# Patient Record
Sex: Female | Born: 1940 | Race: White | Hispanic: No | Smoking: Former smoker
Health system: Southern US, Community
[De-identification: ages and names within clinical notes are randomized; demographics above are authoritative.]

## PROBLEM LIST (undated history)

## (undated) DIAGNOSIS — Z8719 Personal history of other diseases of the digestive system: Secondary | ICD-10-CM

## (undated) DIAGNOSIS — I82409 Acute embolism and thrombosis of unspecified deep veins of unspecified lower extremity: Secondary | ICD-10-CM

## (undated) DIAGNOSIS — K219 Gastro-esophageal reflux disease without esophagitis: Secondary | ICD-10-CM

## (undated) DIAGNOSIS — J45909 Unspecified asthma, uncomplicated: Secondary | ICD-10-CM

## (undated) DIAGNOSIS — I4891 Unspecified atrial fibrillation: Secondary | ICD-10-CM

## (undated) DIAGNOSIS — M1990 Unspecified osteoarthritis, unspecified site: Secondary | ICD-10-CM

## (undated) DIAGNOSIS — D75839 Thrombocytosis, unspecified: Secondary | ICD-10-CM

## (undated) DIAGNOSIS — K5792 Diverticulitis of intestine, part unspecified, without perforation or abscess without bleeding: Secondary | ICD-10-CM

## (undated) DIAGNOSIS — Z87442 Personal history of urinary calculi: Secondary | ICD-10-CM

## (undated) DIAGNOSIS — K449 Diaphragmatic hernia without obstruction or gangrene: Secondary | ICD-10-CM

## (undated) DIAGNOSIS — N1832 Chronic kidney disease, stage 3b: Secondary | ICD-10-CM

## (undated) DIAGNOSIS — Z9889 Other specified postprocedural states: Secondary | ICD-10-CM

## (undated) DIAGNOSIS — D649 Anemia, unspecified: Secondary | ICD-10-CM

## (undated) DIAGNOSIS — H269 Unspecified cataract: Secondary | ICD-10-CM

## (undated) DIAGNOSIS — I480 Paroxysmal atrial fibrillation: Secondary | ICD-10-CM

## (undated) DIAGNOSIS — Z148 Genetic carrier of other disease: Secondary | ICD-10-CM

## (undated) DIAGNOSIS — E119 Type 2 diabetes mellitus without complications: Secondary | ICD-10-CM

## (undated) DIAGNOSIS — I499 Cardiac arrhythmia, unspecified: Secondary | ICD-10-CM

## (undated) DIAGNOSIS — J189 Pneumonia, unspecified organism: Secondary | ICD-10-CM

## (undated) DIAGNOSIS — C801 Malignant (primary) neoplasm, unspecified: Secondary | ICD-10-CM

## (undated) HISTORY — DX: Unspecified osteoarthritis, unspecified site: M19.90

## (undated) HISTORY — DX: Unspecified atrial fibrillation: I48.91

## (undated) HISTORY — DX: Type 2 diabetes mellitus without complications: E11.9

## (undated) HISTORY — DX: Acute embolism and thrombosis of unspecified deep veins of unspecified lower extremity: I82.409

## (undated) HISTORY — PX: BREAST SURGERY: SHX581

## (undated) HISTORY — DX: Paroxysmal atrial fibrillation: I48.0

---
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
## 1970-10-08 HISTORY — PX: APPENDECTOMY: SHX54

## 1970-10-08 HISTORY — PX: CHOLECYSTECTOMY: SHX55

## 2007-10-09 HISTORY — PX: OTHER SURGICAL HISTORY: SHX169

## 2007-12-07 DIAGNOSIS — I219 Acute myocardial infarction, unspecified: Secondary | ICD-10-CM

## 2007-12-07 HISTORY — DX: Acute myocardial infarction, unspecified: I21.9

## 2013-10-08 DIAGNOSIS — C50919 Malignant neoplasm of unspecified site of unspecified female breast: Secondary | ICD-10-CM

## 2013-10-08 DIAGNOSIS — C50911 Malignant neoplasm of unspecified site of right female breast: Secondary | ICD-10-CM

## 2013-10-08 DIAGNOSIS — C801 Malignant (primary) neoplasm, unspecified: Secondary | ICD-10-CM

## 2013-10-08 DIAGNOSIS — Z923 Personal history of irradiation: Secondary | ICD-10-CM

## 2013-10-08 HISTORY — DX: Malignant neoplasm of unspecified site of right female breast: C50.911

## 2013-10-08 HISTORY — DX: Malignant (primary) neoplasm, unspecified: C80.1

## 2013-10-08 HISTORY — PX: BREAST SURGERY: SHX581

## 2013-10-08 HISTORY — PX: TUMOR REMOVAL: SHX12

## 2013-10-08 HISTORY — DX: Malignant neoplasm of unspecified site of unspecified female breast: C50.919

## 2013-10-08 HISTORY — PX: BREAST LUMPECTOMY: SHX2

## 2013-10-08 HISTORY — DX: Personal history of irradiation: Z92.3

## 2014-10-08 DIAGNOSIS — A0472 Enterocolitis due to Clostridium difficile, not specified as recurrent: Secondary | ICD-10-CM

## 2014-10-08 HISTORY — DX: Enterocolitis due to Clostridium difficile, not specified as recurrent: A04.72

## 2017-10-08 DIAGNOSIS — A419 Sepsis, unspecified organism: Secondary | ICD-10-CM

## 2017-10-08 HISTORY — DX: Sepsis, unspecified organism: A41.9

## 2018-05-08 HISTORY — PX: COLECTOMY: SHX59

## 2019-08-05 ENCOUNTER — Ambulatory Visit: Payer: Medicare Other | Admitting: Gastroenterology

## 2019-08-12 ENCOUNTER — Telehealth: Payer: Self-pay

## 2019-08-12 NOTE — Telephone Encounter (Signed)
Called patient to move patient from [place] to [place] and move her time on 09/17/2019. Patient wants to come at 11:00am on 09/17/19. Patient would like [place] to mail her the address and a reminder of the appointment.

## 2019-09-17 ENCOUNTER — Encounter: Payer: Self-pay | Admitting: Gastroenterology

## 2019-09-17 ENCOUNTER — Encounter (INDEPENDENT_AMBULATORY_CARE_PROVIDER_SITE_OTHER): Payer: Self-pay

## 2019-09-17 ENCOUNTER — Other Ambulatory Visit: Payer: Self-pay

## 2019-09-17 ENCOUNTER — Ambulatory Visit: Payer: Medicare Other | Admitting: Gastroenterology

## 2019-09-17 VITALS — BP 125/85 | HR 81 | Temp 98.4°F | Ht <= 58 in | Wt 188.6 lb

## 2019-09-17 DIAGNOSIS — K5732 Diverticulitis of large intestine without perforation or abscess without bleeding: Secondary | ICD-10-CM
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
## 2019-09-18 ENCOUNTER — Encounter: Payer: Self-pay | Admitting: Gastroenterology

## 2019-09-24 ENCOUNTER — Telehealth: Payer: Self-pay | Admitting: Gastroenterology

## 2019-09-24 NOTE — Telephone Encounter (Signed)
Pt left vm she states she was supposed to call you back with a a day for her colonoscopy she states Friday after the new years be best

## 2019-09-25 ENCOUNTER — Other Ambulatory Visit: Payer: Self-pay

## 2019-09-25 ENCOUNTER — Telehealth: Payer: Self-pay

## 2019-09-25 DIAGNOSIS — K219 Gastro-esophageal reflux disease without esophagitis: Secondary | ICD-10-CM

## 2019-09-25 DIAGNOSIS — K449 Diaphragmatic hernia without obstruction or gangrene: Secondary | ICD-10-CM

## 2019-09-25 NOTE — Telephone Encounter (Signed)
Returned call to schedule patients colonoscopy.  She states that Dr. Allen Norris wanted her to have an upper and lower.  Reviewed chart note with Ginger. Chart note mentioned colonoscopy.  Discussed with Ginger and added an upper due to GERD, and hiatal hernia.  Scheduled for January 8th 2021.  Thanks [employer]

## 2019-10-14 ENCOUNTER — Other Ambulatory Visit: Payer: Self-pay

## 2019-10-14 ENCOUNTER — Other Ambulatory Visit
Admission: RE | Admit: 2019-10-14 | Discharge: 2019-10-14 | Disposition: A | Discharge status: Home / Self Care | Payer: Medicare Other | Source: Ambulatory Visit | Attending: Gastroenterology | Admitting: Gastroenterology

## 2019-10-14 DIAGNOSIS — Z20822 Contact with and (suspected) exposure to covid-19: Secondary | ICD-10-CM | POA: Diagnosis not present

## 2019-10-14 DIAGNOSIS — Z01812 Encounter for preprocedural laboratory examination: Secondary | ICD-10-CM | POA: Diagnosis present

## 2019-10-14 LAB — SARS CORONAVIRUS 2 (TAT 6-24 HRS): SARS Coronavirus 2: NEGATIVE

## 2019-10-15 ENCOUNTER — Encounter: Payer: Self-pay | Admitting: Gastroenterology

## 2019-10-16 ENCOUNTER — Encounter: Payer: Self-pay | Admitting: Gastroenterology

## 2019-10-16 ENCOUNTER — Ambulatory Visit: Payer: Medicare Other | Admitting: Registered Nurse

## 2019-10-16 ENCOUNTER — Encounter
Admission: RE | Disposition: A | Discharge status: Home / Self Care | Payer: Self-pay | Source: Home / Self Care | Attending: Gastroenterology

## 2019-10-16 ENCOUNTER — Ambulatory Visit
Admission: RE | Admit: 2019-10-16 | Discharge: 2019-10-16 | Disposition: A | Discharge status: Home / Self Care | Payer: Medicare Other | Attending: Gastroenterology | Admitting: Gastroenterology

## 2019-10-16 ENCOUNTER — Other Ambulatory Visit: Payer: Self-pay

## 2019-10-16 DIAGNOSIS — K635 Polyp of colon: Secondary | ICD-10-CM | POA: Diagnosis not present

## 2019-10-16 DIAGNOSIS — R12 Heartburn: Secondary | ICD-10-CM | POA: Insufficient documentation

## 2019-10-16 DIAGNOSIS — Z79899 Other long term (current) drug therapy: Secondary | ICD-10-CM | POA: Insufficient documentation

## 2019-10-16 DIAGNOSIS — Z885 Allergy status to narcotic agent status: Secondary | ICD-10-CM | POA: Insufficient documentation

## 2019-10-16 DIAGNOSIS — K449 Diaphragmatic hernia without obstruction or gangrene: Secondary | ICD-10-CM | POA: Diagnosis not present

## 2019-10-16 DIAGNOSIS — Z881 Allergy status to other antibiotic agents status: Secondary | ICD-10-CM | POA: Diagnosis not present

## 2019-10-16 DIAGNOSIS — Z1211 Encounter for screening for malignant neoplasm of colon: Secondary | ICD-10-CM | POA: Diagnosis not present

## 2019-10-16 DIAGNOSIS — K219 Gastro-esophageal reflux disease without esophagitis: Secondary | ICD-10-CM

## 2019-10-16 DIAGNOSIS — Z888 Allergy status to other drugs, medicaments and biological substances status: Secondary | ICD-10-CM | POA: Insufficient documentation

## 2019-10-16 DIAGNOSIS — K5289 Other specified noninfective gastroenteritis and colitis: Secondary | ICD-10-CM | POA: Diagnosis not present

## 2019-10-16 DIAGNOSIS — K222 Esophageal obstruction: Secondary | ICD-10-CM

## 2019-10-16 DIAGNOSIS — Z933 Colostomy status: Secondary | ICD-10-CM | POA: Insufficient documentation

## 2019-10-16 DIAGNOSIS — Z880 Allergy status to penicillin: Secondary | ICD-10-CM | POA: Insufficient documentation

## 2019-10-16 DIAGNOSIS — D120 Benign neoplasm of cecum: Secondary | ICD-10-CM

## 2019-10-16 HISTORY — PX: COLONOSCOPY WITH PROPOFOL: SHX5780

## 2019-10-16 HISTORY — PX: ESOPHAGOGASTRODUODENOSCOPY (EGD) WITH PROPOFOL: SHX5813

## 2019-10-16 HISTORY — DX: Cardiac arrhythmia, unspecified: I49.9

## 2019-10-16 LAB — GLUCOSE, CAPILLARY: Glucose-Capillary: 120 mg/dL — ABNORMAL HIGH (ref 70–99)

## 2019-10-16 SURGERY — COLONOSCOPY WITH PROPOFOL
Anesthesia: General

## 2019-10-16 MED ORDER — PROPOFOL 10 MG/ML IV BOLUS
INTRAVENOUS | Status: DC | PRN
  Administered 2019-10-16: 80 mg via INTRAVENOUS

## 2019-10-16 MED ORDER — SODIUM CHLORIDE 0.9 % IV SOLN
INTRAVENOUS | Status: DC
  Administered 2019-10-16: 1000 mL via INTRAVENOUS

## 2019-10-16 MED ORDER — PROPOFOL 500 MG/50ML IV EMUL
INTRAVENOUS | Status: DC | PRN
  Administered 2019-10-16: 140 ug/kg/min via INTRAVENOUS

## 2019-10-16 NOTE — Anesthesia Preprocedure Evaluation (Signed)
Anesthesia Evaluation  Patient identified by MRN, date of birth, ID band Patient awake    Reviewed: Allergy & Precautions, NPO status , Patient's Chart, lab work & pertinent test results  History of Anesthesia Complications Negative for: history of anesthetic complications  Airway Mallampati: II  TM Distance: >3 FB Neck ROM: Full    Dental  (+) Edentulous Upper, Edentulous Lower   Pulmonary neg pulmonary ROS, neg sleep apnea, neg COPD,    breath sounds clear to auscultation- rhonchi (-) wheezing      Cardiovascular (-) hypertension+ Past MI  (-) CAD, (-) Cardiac Stents and (-) CABG + dysrhythmias (s/p ablation) Atrial Fibrillation  Rhythm:Regular Rate:Normal - Systolic murmurs and - Diastolic murmurs    Neuro/Psych neg Seizures negative neurological ROS  negative psych ROS   GI/Hepatic negative GI ROS, Neg liver ROS,   Endo/Other  negative endocrine ROSneg diabetes  Renal/GU negative Renal ROS     Musculoskeletal negative musculoskeletal ROS (+)   Abdominal (+) - obese,   Peds  Hematology negative hematology ROS (+)   Anesthesia Other Findings Past Medical History: No date: Cancer ([place])     Comment:  breast No date: Dysrhythmia   Reproductive/Obstetrics                             Anesthesia Physical Anesthesia Plan  ASA: II  Anesthesia Plan: General   Post-op Pain Management:    Induction: Intravenous  PONV Risk Score and Plan: 2 and Propofol infusion  Airway Management Planned: Natural Airway  Additional Equipment:   Intra-op Plan:   Post-operative Plan:   Informed Consent: I have reviewed the patients History and Physical, chart, labs and discussed the procedure including the risks, benefits and alternatives for the proposed anesthesia with the patient or authorized representative who has indicated his/her understanding and acceptance.     Dental advisory  given  Plan Discussed with: CRNA and Anesthesiologist  Anesthesia Plan Comments:         Anesthesia Quick Evaluation

## 2019-10-16 NOTE — Brief Op Note (Signed)
Colonoscopy performed through colonic stoma, then rectum.

## 2019-10-16 NOTE — Interval H&P Note (Signed)
History and Physical Interval Note:  10/16/2019 11:27 AM  Nancy Hickman  has presented today for surgery, with the diagnosis of Hiatal hernia, GERD, Colostomy, Diverticulitis.  The various methods of treatment have been discussed with the patient and family. After consideration of risks, benefits and other options for treatment, the patient has consented to  Procedure(s): COLONOSCOPY WITH PROPOFOL (N/A) ESOPHAGOGASTRODUODENOSCOPY (EGD) WITH PROPOFOL (N/A) as a surgical intervention.  The patient's history has been reviewed, patient examined, no change in status, stable for surgery.  I have reviewed the patient's chart and labs.  Questions were answered to the patient's satisfaction.     [name] [employer]

## 2019-10-16 NOTE — Transfer of Care (Signed)
Immediate Anesthesia Transfer of Care Note  Patient: Nancy Hickman  Procedure(s) Performed: COLONOSCOPY WITH PROPOFOL (N/A ) ESOPHAGOGASTRODUODENOSCOPY (EGD) WITH PROPOFOL (N/A )  Patient Location: PACU  Anesthesia Type:General  Level of Consciousness: sedated  Airway & Oxygen Therapy: Patient Spontanous Breathing and Patient connected to nasal cannula oxygen  Post-op Assessment: Report given to RN and Post -op Vital signs reviewed and stable  Post vital signs: Reviewed and stable  Last Vitals:  Vitals Value Taken Time  BP 103/61 10/16/19 1203  Temp    Pulse 95 10/16/19 1203  Resp 18 10/16/19 1203  SpO2 90 % 10/16/19 1203  Vitals shown include unvalidated device data.  Last Pain:  Vitals:   10/16/19 1201  TempSrc: (P) Temporal  PainSc:          Complications: No apparent anesthesia complications

## 2019-10-16 NOTE — Op Note (Signed)
[facility] Gastroenterology Patient Name: Nancy Hickman Procedure Date: 10/16/2019 11:26 AM MRN: [id number] Account #: [id number] Date of Birth: 1941-09-28 Admit Type: Outpatient Age: 79 Room: [facility] ENDO ROOM 4 Gender: Female Note Status: Finalized Procedure:             Upper GI endoscopy Indications:           Heartburn Providers:             Lucilla Lame MD, MD Medicines:             Propofol per Anesthesia Complications:         No immediate complications. Procedure:             Pre-Anesthesia Assessment:                        - Prior to the procedure, a History and Physical was                         performed, and patient medications and allergies were                         reviewed. The patient's tolerance of previous                         anesthesia was also reviewed. The risks and benefits                         of the procedure and the sedation options and risks                         were discussed with the patient. All questions were                         answered, and informed consent was obtained. Prior                         Anticoagulants: The patient has taken no previous                         anticoagulant or antiplatelet agents. ASA Grade                         Assessment: II - A patient with mild systemic disease.                         After reviewing the risks and benefits, the patient                         was deemed in satisfactory condition to undergo the                         procedure.                        After obtaining informed consent, the endoscope was                         passed under direct vision. Throughout the procedure,  the patient's blood pressure, pulse, and oxygen                         saturations were monitored continuously. The Endoscope                         was introduced through the mouth, and advanced to the                         second part of duodenum. The upper  GI endoscopy was                         accomplished without difficulty. The patient tolerated                         the procedure well. Findings:      A large hiatal hernia was present.      One benign-appearing, intrinsic moderate stenosis was found at the       gastroesophageal junction. The stenosis was traversed. A TTS dilator was       passed through the scope. Dilation with a 15-16.5-18 mm balloon dilator       was performed to 18 mm. The dilation site was examined following       endoscope reinsertion and showed complete resolution of luminal       narrowing.      The stomach was normal.      The examined duodenum was normal. Impression:            - Large hiatal hernia.                        - Benign-appearing esophageal stenosis. Dilated.                        - Normal stomach.                        - Normal examined duodenum.                        - No specimens collected. Recommendation:        - Discharge patient to home.                        - Resume previous diet.                        - Continue present medications.                        - Perform a colonoscopy today. Procedure Code(s):     --- Professional ---                        [phone], Esophagogastroduodenoscopy, flexible,                         transoral; with transendoscopic balloon dilation of                         esophagus (less than 30 mm diameter) CPT copyright 2019 American Medical Association. All rights reserved.  The codes documented in this report are preliminary and upon coder review may  be revised to meet current compliance requirements. Lucilla Lame MD, MD 10/16/2019 11:44:19 AM This report has been signed electronically. Number of Addenda: 0 Note Initiated On: 10/16/2019 11:26 AM Estimated Blood Loss:  Estimated blood loss: none.      [facility]

## 2019-10-16 NOTE — Anesthesia Postprocedure Evaluation (Signed)
Anesthesia Post Note  Patient: Nancy Hickman  Procedure(s) Performed: COLONOSCOPY WITH PROPOFOL (N/A ) ESOPHAGOGASTRODUODENOSCOPY (EGD) WITH PROPOFOL (N/A )  Patient location during evaluation: Endoscopy Anesthesia Type: General Level of consciousness: awake and alert and oriented Pain management: pain level controlled Vital Signs Assessment: post-procedure vital signs reviewed and stable Respiratory status: spontaneous breathing, nonlabored ventilation and respiratory function stable Cardiovascular status: blood pressure returned to baseline and stable Postop Assessment: no signs of nausea or vomiting Anesthetic complications: no     Last Vitals:  Vitals:   10/16/19 1221 10/16/19 1230  BP: 120/81 (!) 124/97  Pulse: 91 92  Resp: 16 20  Temp:    SpO2: 100% 100%    Last Pain:  Vitals:   10/16/19 1230  TempSrc:   PainSc: 0-No pain                 Nicle Connole

## 2019-10-16 NOTE — Op Note (Signed)
[facility] Gastroenterology Patient Name: Nancy Hickman Procedure Date: 10/16/2019 11:25 AM MRN: [id number] Account #: [id number] Date of Birth: 02-09-1941 Admit Type: Outpatient Age: 79 Room: [facility] ENDO ROOM 4 Gender: Female Note Status: Finalized Procedure:             Colonoscopy Indications:           Screening for colorectal malignant neoplasm Providers:             Lucilla Lame MD, MD Medicines:             Propofol per Anesthesia Complications:         No immediate complications. Procedure:             Pre-Anesthesia Assessment:                        - Prior to the procedure, a History and Physical was                         performed, and patient medications and allergies were                         reviewed. The patient's tolerance of previous                         anesthesia was also reviewed. The risks and benefits                         of the procedure and the sedation options and risks                         were discussed with the patient. All questions were                         answered, and informed consent was obtained. Prior                         Anticoagulants: The patient has taken no previous                         anticoagulant or antiplatelet agents. ASA Grade                         Assessment: II - A patient with mild systemic disease.                         After reviewing the risks and benefits, the patient                         was deemed in satisfactory condition to undergo the                         procedure.                        After obtaining informed consent, the colonoscope was                         passed under direct vision. Throughout the procedure,  the patient's blood pressure, pulse, and oxygen                         saturations were monitored continuously. The                         Colonoscope was introduced through the descending                         colostomy and  advanced to the the cecum, identified by                         appendiceal orifice and ileocecal valve. The                         colonoscopy was performed without difficulty. The                         patient tolerated the procedure well. The quality of                         the bowel preparation was excellent. The Colonoscope                         was introduced through the anus and advanced to the                         the sigmoid colon to examine an anastomosis. This was                         the intended extent. Findings:      The perianal and digital rectal examinations were normal.      A 3 mm polyp was found in the cecum. The polyp was sessile. The polyp       was removed with a cold biopsy forceps. Resection and retrieval were       complete.      A few small-mouthed diverticula were found in the descending colon.      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 Impression:            - One 3 mm polyp in the cecum, removed with a cold                         biopsy forceps. Resected and retrieved.                        - Diverticulosis in the descending colon.                        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 Recommendation:        -  Discharge patient to home.                        - Resume previous diet.                        - Continue present medications.                        - Await pathology results. Procedure Code(s):     --- Professional ---                        [id number], 32, Colonoscopy through stoma; with biopsy,                         single or multiple Diagnosis Code(s):     --- Professional ---                        Z12.11, Encounter for screening for malignant neoplasm                         of colon                         K63.5, Polyp of colon CPT copyright 2019 American Medical Association. All rights reserved. The codes documented in this report are preliminary and upon coder review may  be revised to meet current compliance requirements. Lucilla Lame MD, MD 10/16/2019 12:00:48 PM This report has been signed electronically. Number of Addenda: 0 Note Initiated On: 10/16/2019 11:25 AM Scope Withdrawal Time: 0 hours 6 minutes 52 seconds  Estimated Blood Loss:  Estimated blood loss: none.      [facility]

## 2019-10-19 LAB — SURGICAL PATHOLOGY

## 2019-10-20 ENCOUNTER — Encounter: Payer: Self-pay | Admitting: Gastroenterology

## 2020-02-18 ENCOUNTER — Other Ambulatory Visit: Payer: Self-pay

## 2020-02-18 ENCOUNTER — Emergency Department
Admission: EM | Admit: 2020-02-18 | Discharge: 2020-02-18 | Disposition: A | Discharge status: Home / Self Care | Payer: Medicare Other | Attending: Emergency Medicine | Admitting: Emergency Medicine

## 2020-02-18 ENCOUNTER — Emergency Department: Payer: Medicare Other

## 2020-02-18 DIAGNOSIS — R1032 Left lower quadrant pain: Secondary | ICD-10-CM | POA: Diagnosis present

## 2020-02-18 DIAGNOSIS — Z79899 Other long term (current) drug therapy: Secondary | ICD-10-CM | POA: Diagnosis not present

## 2020-02-18 DIAGNOSIS — N200 Calculus of kidney: Secondary | ICD-10-CM | POA: Insufficient documentation

## 2020-02-18 LAB — COMPREHENSIVE METABOLIC PANEL
ALT: 23 U/L (ref 0–44)
AST: 26 U/L (ref 15–41)
Albumin: 3.7 g/dL (ref 3.5–5.0)
Alkaline Phosphatase: 112 U/L (ref 38–126)
Anion gap: 8 (ref 5–15)
BUN: 13 mg/dL (ref 8–23)
CO2: 23 mmol/L (ref 22–32)
Calcium: 9.1 mg/dL (ref 8.9–10.3)
Chloride: 107 mmol/L (ref 98–111)
Creatinine, Ser: 1.09 mg/dL — ABNORMAL HIGH (ref 0.44–1.00)
GFR calc Af Amer: 56 mL/min — ABNORMAL LOW (ref >60)
GFR calc non Af Amer: 49 mL/min — ABNORMAL LOW (ref >60)
Glucose, Bld: 163 mg/dL — ABNORMAL HIGH (ref 70–99)
Potassium: 5.4 mmol/L — ABNORMAL HIGH (ref 3.5–5.1)
Sodium: 138 mmol/L (ref 135–145)
Total Bilirubin: 0.5 mg/dL (ref 0.3–1.2)
Total Protein: 7.8 g/dL (ref 6.5–8.1)

## 2020-02-18 LAB — CBC
HCT: 36.4 % (ref 36.0–46.0)
Hemoglobin: 11.7 g/dL — ABNORMAL LOW (ref 12.0–15.0)
MCH: 28.4 pg (ref 26.0–34.0)
MCHC: 32.1 g/dL (ref 30.0–36.0)
MCV: 88.3 fL (ref 80.0–100.0)
Platelets: 443 10*3/uL — ABNORMAL HIGH (ref 150–400)
RBC: 4.12 MIL/uL (ref 3.87–5.11)
RDW: 16.4 % — ABNORMAL HIGH (ref 11.5–15.5)
WBC: 9.4 10*3/uL (ref 4.0–10.5)
nRBC: 0 % (ref 0.0–0.2)

## 2020-02-18 LAB — URINALYSIS, COMPLETE (UACMP) WITH MICROSCOPIC
Bilirubin Urine: NEGATIVE
Glucose, UA: NEGATIVE mg/dL
Ketones, ur: NEGATIVE mg/dL
Leukocytes,Ua: NEGATIVE
Nitrite: NEGATIVE
Protein, ur: NEGATIVE mg/dL
RBC / HPF: >50 RBC/hpf — ABNORMAL HIGH (ref 0–5)
Specific Gravity, Urine: 1.023 (ref 1.005–1.030)
pH: 5 (ref 5.0–8.0)

## 2020-02-18 LAB — LIPASE, BLOOD: Lipase: 32 U/L (ref 11–51)

## 2020-02-18 MED ORDER — TRAMADOL HCL 50 MG PO TABS: 50.0000 mg | ORAL_TABLET | Freq: Four times a day (QID) | ORAL | 0 refills | Status: DC | PRN

## 2020-02-18 MED ORDER — SODIUM CHLORIDE 0.9 % IV BOLUS
500.0000 mL | Freq: Once | INTRAVENOUS | Status: AC
  Administered 2020-02-18: 500 mL via INTRAVENOUS

## 2020-02-18 MED ORDER — FENTANYL CITRATE (PF) 100 MCG/2ML IJ SOLN
50.0000 ug | Freq: Once | INTRAMUSCULAR | Status: AC
  Administered 2020-02-18: 50 ug via INTRAVENOUS
  Filled 2020-02-18: qty 2

## 2020-02-18 MED ORDER — ONDANSETRON HCL 4 MG/2ML IJ SOLN
4.0000 mg | Freq: Once | INTRAMUSCULAR | Status: AC
  Administered 2020-02-18: 4 mg via INTRAVENOUS
  Filled 2020-02-18: qty 2

## 2020-02-18 MED ORDER — ONDANSETRON HCL 4 MG PO TABS: 4.0000 mg | ORAL_TABLET | Freq: Three times a day (TID) | ORAL | 0 refills | Status: DC | PRN

## 2020-02-18 NOTE — ED Notes (Signed)
Pt reports LLQ abdominal pain that started last week and went away. Pt states pain returned yesterday and has consistently worsened. Pt has colostomy in place since 2019. States no trouble with site. Reports diarrhea yesterday but believes it is due to food she ate. Pt states she is nauseated and is dry heaving.

## 2020-02-18 NOTE — ED Triage Notes (Signed)
Pt here with abd pain that started last week but went away. Pt believes that it may be a kidney stone. Pt in NAD.

## 2020-02-18 NOTE — Discharge Instructions (Addendum)
As we discussed please talk to your primary care physician about obtaining a dedicated MRI of your kidneys. Please seek medical attention for any high fevers, chest pain, shortness of breath, change in behavior, persistent vomiting, bloody stool or any other new or concerning symptoms.

## 2020-02-18 NOTE — ED Notes (Signed)
Awaiting pt to arrive to room from CT

## 2020-02-18 NOTE — ED Provider Notes (Signed)
[facility] Emergency Department Provider Note   ____________________________________________   I have reviewed the triage vital signs and the nursing notes.   HISTORY  Chief Complaint Abdominal Pain   History limited by: Not Limited   HPI Nancy Hickman is a 79 y.o. female who presents to the emergency department today because of concerns for abdominal pain.  The patient states that the pain is located in the left lower quadrant.  She had the pain 2 times a last week although that was short-lived.  She states that today the pain has been more constant.  It has not moved.  Has been accompanied by nausea and dry heaves.  Patient denies any fevers.  She states she does have a history of kidney stones roughly 8 years ago. No fevers.     Records reviewed. Per medical record review patient has a history of colectomy, appendectomy, cholecystectomy.  Past Medical History:  Diagnosis Date  . Cancer [facility])    breast  . Dysrhythmia     Patient Active Problem List   Diagnosis Date Noted  . Hiatal hernia with GERD   . Stricture and stenosis of esophagus   . Special screening for malignant neoplasms, colon   . Benign neoplasm of cecum     Past Surgical History:  Procedure Laterality Date  . ablation for atrial fibrillation  2009  . APPENDECTOMY    . BREAST SURGERY    . CHOLECYSTECTOMY    . COLECTOMY     due to diverticulitis/ 12 inches of colon removed  . COLONOSCOPY WITH PROPOFOL N/A 10/16/2019   Procedure: COLONOSCOPY WITH PROPOFOL;  Surgeon: Lucilla Lame, MD;  Location: [facility] ENDOSCOPY;  Service: Endoscopy;  Laterality: N/A;  . ESOPHAGOGASTRODUODENOSCOPY (EGD) WITH PROPOFOL N/A 10/16/2019   Procedure: ESOPHAGOGASTRODUODENOSCOPY (EGD) WITH PROPOFOL;  Surgeon: Lucilla Lame, MD;  Location: [facility] ENDOSCOPY;  Service: Endoscopy;  Laterality: N/A;  . spleen removal    . TUMOR REMOVAL  2014   Breast cancer    Prior to Admission medications   Medication Sig  Start Date End Date Taking? Authorizing Provider  Blood Glucose Monitoring Suppl (ONE TOUCH ULTRA 2) w/Device KIT  06/19/19   [provider]  fluticasone Asencion Islam) 50 MCG/ACT nasal spray  06/18/19   [provider]  Lancets (ONETOUCH DELICA PLUS [id number]) [place]  06/19/19   [provider]  metoprolol succinate (TOPROL-XL) 25 MG 24 hr tablet Take 25 mg by mouth daily. 06/18/19   [provider]  omeprazole (PRILOSEC) 20 MG capsule Take 20 mg by mouth daily. 06/18/19   [provider]    Allergies Doxycycline, Lorcet plus [hydrocodone-acetaminophen], Penicillins, and Statins  No family history on file.  Social History Social History   Tobacco Use  . Smoking status: Never Smoker  . Smokeless tobacco: Never Used  Substance Use Topics  . Alcohol use: Never  . Drug use: Not on file    Review of Systems Constitutional: No fever/chills Eyes: No visual changes. ENT: No sore throat. Cardiovascular: Denies chest pain. Respiratory: Denies shortness of breath. Gastrointestinal: Positive for left lower abdominal pain and nausea.  Genitourinary: Negative for dysuria. Musculoskeletal: Negative for back pain. Skin: Negative for rash. Neurological: Negative for headaches, focal weakness or numbness.  ____________________________________________   PHYSICAL EXAM:  VITAL SIGNS: ED Triage Vitals [02/18/20 1741]  Enc Vitals Group     BP 115/69     Pulse Rate 76     Resp 18     Temp 98.2 F (36.8  C)     Temp Source Oral     SpO2 98 %     Weight 180 lb (81.6 kg)     Height '4\' 10"'$  (1.473 m)     Head Circumference      Peak Flow      Pain Score 10   Constitutional: Alert and oriented.  Eyes: Conjunctivae are normal.  ENT      Head: Normocephalic and atraumatic.      Nose: No congestion/rhinnorhea.      Mouth/Throat: Mucous membranes are moist.      Neck: No stridor. Hematological/Lymphatic/Immunilogical: No cervical  lymphadenopathy. Cardiovascular: Normal rate, regular rhythm.  No murmurs, rubs, or gallops.  Respiratory: Normal respiratory effort without tachypnea nor retractions. Breath sounds are clear and equal bilaterally. No wheezes/rales/rhonchi. Gastrointestinal: Soft and non tender. Ostomy. Genitourinary: Deferred Musculoskeletal: Normal range of motion in all extremities. No lower extremity edema. Neurologic:  Normal speech and language. No gross focal neurologic deficits are appreciated.  Skin:  Skin is warm, dry and intact. No rash noted. Psychiatric: Mood and affect are normal. Speech and behavior are normal. Patient exhibits appropriate insight and judgment.  ____________________________________________    LABS (pertinent positives/negatives)  Lipase 32 CMP na 138, k 5.4, glu 163, cr 1.09 UA cloudy, large hgb dipstick, > 50 RBC, 6-10 wbc CBC wbc 9.4, hgb 11.7, plt 443 ____________________________________________   EKG  None  ____________________________________________    RADIOLOGY  CT renal stone Left sided punctate kidney stone  ____________________________________________   PROCEDURES  Procedures  ____________________________________________   INITIAL IMPRESSION / ASSESSMENT AND PLAN / ED COURSE  Pertinent labs & imaging results that were available during my care of the patient were reviewed by me and considered in my medical decision making (see chart for details).   Patient presented to the emergency department today because of concerns for left lower quadrant abdominal pain.  Work-up is consistent with a small punctate kidney stone.  This time I have a low suspicion for infection given lack of fever, leukocytosis or urinary findings consistent with concerning infection, however will send culture. Will treat for kidney stone. Discussed expected course. Will give urology follow up.   ____________________________________________   FINAL CLINICAL IMPRESSION(S)  / ED DIAGNOSES  Final diagnoses:  Kidney stone     Note: This dictation was prepared with Dragon dictation. Any transcriptional errors that result from this process are unintentional     Nance Pear, MD 02/18/20 2303

## 2020-02-20 LAB — URINE CULTURE: Culture: >=100000 — AB

## 2020-03-22 ENCOUNTER — Other Ambulatory Visit: Payer: Self-pay

## 2020-03-22 DIAGNOSIS — K5790 Diverticulosis of intestine, part unspecified, without perforation or abscess without bleeding: Secondary | ICD-10-CM

## 2020-04-04 ENCOUNTER — Other Ambulatory Visit: Payer: Self-pay

## 2020-04-04 DIAGNOSIS — K5790 Diverticulosis of intestine, part unspecified, without perforation or abscess without bleeding: Secondary | ICD-10-CM

## 2020-04-06 ENCOUNTER — Encounter: Payer: Self-pay | Admitting: Surgery

## 2020-04-06 ENCOUNTER — Telehealth: Payer: Self-pay | Admitting: Emergency Medicine

## 2020-04-06 ENCOUNTER — Other Ambulatory Visit: Payer: Self-pay

## 2020-04-06 ENCOUNTER — Ambulatory Visit: Payer: Medicare Other | Admitting: Surgery

## 2020-04-06 VITALS — BP 139/76 | HR 85 | Temp 94.5°F | Resp 14 | Ht <= 58 in | Wt 190.0 lb

## 2020-04-06 DIAGNOSIS — K435 Parastomal hernia without obstruction or  gangrene: Secondary | ICD-10-CM | POA: Diagnosis not present

## 2020-04-06 DIAGNOSIS — Z933 Colostomy status: Secondary | ICD-10-CM

## 2020-04-06 DIAGNOSIS — K432 Incisional hernia without obstruction or gangrene: Secondary | ICD-10-CM

## 2020-04-06 MED ORDER — NEOMYCIN SULFATE 500 MG PO TABS: ORAL_TABLET | ORAL | 0 refills | Status: DC

## 2020-04-06 MED ORDER — ERYTHROMYCIN BASE 500 MG PO TABS: ORAL_TABLET | ORAL | 0 refills | Status: DC

## 2020-04-06 MED ORDER — POLYETHYLENE GLYCOL 3350 17 GM/SCOOP PO POWD: ORAL | 0 refills | Status: DC

## 2020-04-06 MED ORDER — BISACODYL 5 MG PO TBEC: DELAYED_RELEASE_TABLET | ORAL | 0 refills | Status: DC

## 2020-04-06 NOTE — Patient Instructions (Addendum)
Our surgery scheduler will contact you to schedule your surgery. Please have the [facility] Sheet available when she calls you.   In the meantime we will send Dr Rebeca Alert a Medical Clearance form to make sure we can proceed with surgery. Their office will contact you if you need any work up done.   You will need to complete a Miralax colon bowel prep. Please review the sheet that was handed to you. Pick up your medications at your local pharmacy. Once at the pharmacy make sure to pick up a 64 ounce bottle of Gatorade (no red) and fleet's enemas.   If we are thinking surgery to be on July 15th. Please stop Aspirin 5 days prior to surgery. Last dose will be July 9th, 2021.  Please call the office if you have any questions or concerns.   Colostomy Reversal Surgery A colostomy reversal is a surgical procedure that is done to reverse a colostomy. In this reversal procedure, the large intestine is disconnected from the opening in the abdomen (stoma). Then, the changes that were made to the intestine during the colostomy will be reversed to restore the flow of stool through the entire intestine. Depending on the type of colostomy being reversed, this may involve one of the following:  Reconnecting the two ends of the intestine that were separated during colostomy surgery.  Closing the opening that was made in the side of the intestine to allow stool to be redirected through the stoma. After this surgery, a stoma and colostomy bag are no longer needed. Stool (feces) can leave your body through the rectum, as it did before you had a colostomy. Tell a health care provider about:  Any allergies you have.  All medicines you are taking, including vitamins, herbs, eye drops, creams, and over-the-counter medicines.  Any problems you or family members have had with anesthetic medicines.  Any blood disorders you have.  Any surgeries you have had.  Any medical conditions you have.  Whether you are pregnant or  may be pregnant. What are the risks? Generally, this is a safe procedure. However, problems may occur, including:  Infection.  Bleeding.  Allergic reactions to medicines.  Damage to other structures or organs.  A temporary condition in which the intestines stop moving and working correctly (ileus). This usually goes away in 3-7 days.  A collection of pus (abscess) in the abdomen or pelvis.  Intestinal blockage.  Leaking at the area of the intestine where it was reconnected (anastomotic leak) or where the opening of the stoma was closed.  Narrowing of the intestine (stricture) at the place where it was reconnected.  Urinary and sexual dysfunction. What happens before the procedure? Medicines Ask your health care provider about:  Changing or stopping your regular medicines. This is especially important if you are taking diabetes medicines or blood thinners.  Taking medicines such as aspirin and ibuprofen. These medicines can thin your blood. Do not take these medicines unless your health care provider tells you to take them.  Taking over-the-counter medicines, vitamins, herbs, and supplements. Staying hydrated Follow instructions from your health care provider about hydration, which may include:  Up to 2 hours before the procedure - you may continue to drink clear liquids, such as water, clear fruit juice, black coffee, and plain tea.  Eating and drinking restrictions Follow instructions from your health care provider about eating and drinking, which may include:  8 hours before the procedure - stop eating heavy meals or foods, such as meat,  fried foods, or fatty foods.  6 hours before the procedure - stop eating light meals or foods, such as toast or cereal.  6 hours before the procedure - stop drinking milk or drinks that contain milk.  2 hours before the procedure - stop drinking clear liquids. General instructions  You may have an exam or testing.  Plan to have  someone take you home after the procedure.  Plan to have a responsible adult care for you for at least 24 hours after you leave the hospital or clinic. This is important.  Do not use any products that contain nicotine or tobacco, such as cigarettes, e-cigarettes, and chewing tobacco. These can delay incision healing after surgery. If you need help quitting, ask your health care provider.  Ask your health care provider what steps will be taken to help prevent infection. These may include: ? Removing hair at the surgery site. ? Washing skin with a germ-killing soap. ? Antibiotic medicine. What happens during the procedure?   An IV will be inserted into one of your veins.  You may be given: ? A medicine to help you relax (sedative). ? A medicine to make you fall asleep (general anesthetic).  An incision will be made in your abdomen at the site of the stoma.  The large intestine will be disconnected from the abdomen at the site of the stoma.  The next steps will vary depending on the type of colostomy reversal surgery you are having. There are two main types: ? End colostomy reversal. The surgeon will use stitches (sutures) or staples to reconnect the two ends of the intestine that were separated during the end colostomy. ? Loop colostomy reversal. The surgeon will use sutures or staples to close the opening in the intestine that had been allowing stool to be redirected through the stoma. The intestine will then be put back into its normal position inside the abdomen.  The incision will be closed with sutures, skin glue, or adhesive strips. It may be covered with bandages (dressings). The procedure may vary among health care providers and hospitals. What happens after the procedure?  Your blood pressure, heart rate, breathing rate, and blood oxygen level will be monitored until you leave the hospital or clinic.  You will be given pain medicine as needed.  You will slowly increase your  diet and movement as told by your health care provider. Summary  A colostomy reversal is a surgical procedure that is done to reverse a colostomy. After this surgery, stool (feces) can leave your body through the rectum, as it did before you had a colostomy.  Before the procedure, follow instructions from your health care provider about taking medicines and about eating and drinking.  During the procedure, your colostomy will be reversed, and the incision will be closed with sutures, skin glue, or adhesive strips. It may be covered with bandages (dressings).  After the procedure, you will slowly increase your diet and movement as told by your health care provider. This information is not intended to replace advice given to you by your health care provider. Make sure you discuss any questions you have with your health care provider. Document Revised: 03/12/2018 Document Reviewed: 03/12/2018 Elsevier Patient Education  [place].

## 2020-04-06 NOTE — Progress Notes (Signed)
04/06/2020  Reason for Visit:  Colostomy in place  Referring Provider:  Lucilla Lame, MD  History of Present Illness: Nancy Hickman is a 79 y.o. female presenting for evaluation for colostomy reversal.  The patient moved from [place] about a year ago.  She was hospitalized there in August 2019 with perforated diverticulitis and underwent first a sigmoidectomy with primary anastomosis, but required takeback for Hartmann's type surgery due to anastomotic leak.  She was hospitalized for about 6 weeks.  She was getting prepared for colostomy reversal last year, but had to be cancelled as the COVID-19 pandemic restrictions were starting to be placed.  Patient reports that she deals well with the ostomy and has normal stool, with sometimes occasional specks of blood.  She reports some bulging around his abdomen and around the ostomy, but denies any significant pain.  More recently, she presented to the ED for abdominal pain and kidney stone and had a CT scan without contrast which showed a small/medium incisional midline hernia around the umbilicus, as well as a large parastomal hernia containing small bowel.  She was not aware of the hernias, but she does know of a large hiatal hernia that she has had for many years.  The patient overall is functional and does her own chores.  She has arthritis which sometimes limits her mobility, but otherwise walks without assistance most of the time, without any chest pain or dyspnea.  Her hiatal hernia she reports is pushing against part of her lung.  Of notes, she also has a history of open cholecystectomy, appendectomy, and trauma splenectomy.  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  Past Medical History: Past Medical History:  Diagnosis Date  . A-fib ([place])   . Cancer ([place])    breast  . Diabetes mellitus without complication ([place])   . Dysrhythmia   . Myocardial infarction [facility]) 12/2007     Past Surgical  History: Past Surgical History:  Procedure Laterality Date  . ablation for atrial fibrillation  2009  . APPENDECTOMY    . BREAST SURGERY    . CHOLECYSTECTOMY    . COLECTOMY     due to diverticulitis/ 12 inches of colon removed  . COLONOSCOPY WITH PROPOFOL N/A 10/16/2019   Procedure: COLONOSCOPY WITH PROPOFOL;  Surgeon: Lucilla Lame, MD;  Location: [facility] ENDOSCOPY;  Service: Endoscopy;  Laterality: N/A;  . ESOPHAGOGASTRODUODENOSCOPY (EGD) WITH PROPOFOL N/A 10/16/2019   Procedure: ESOPHAGOGASTRODUODENOSCOPY (EGD) WITH PROPOFOL;  Surgeon: Lucilla Lame, MD;  Location: [facility] ENDOSCOPY;  Service: Endoscopy;  Laterality: N/A;  . spleen removal    . TUMOR REMOVAL  2014   Breast cancer    Home Medications: Prior to Admission medications   Medication Sig Start Date End Date Taking? Authorizing Provider  aspirin EC 81 MG tablet Take 81 mg by mouth daily. Swallow whole.   Yes [provider]  Blood Glucose Monitoring Suppl (ONE TOUCH ULTRA 2) w/Device KIT  06/19/19  Yes [provider]  fluticasone Asencion Islam) 50 MCG/ACT nasal spray  06/18/19  Yes [provider]  Lancets (ONETOUCH DELICA PLUS [id number]) [place]  06/19/19  Yes [provider]  metoprolol succinate (TOPROL-XL) 25 MG 24 hr tablet Take 25 mg by mouth daily. 06/18/19  Yes [provider]  omeprazole (PRILOSEC) 20 MG capsule Take 20 mg by mouth daily. 06/18/19  Yes [provider]  traMADol (ULTRAM) 50 MG tablet Take 1 tablet (50 mg total) by mouth every 6 (six) hours as needed. 02/18/20  02/17/21 Yes Nance Pear, MD    Allergies: Allergies  Allergen Reactions  . Doxycycline   . Lorcet Plus [Hydrocodone-Acetaminophen]   . Penicillins   . Statins     Social History:  reports that she has never smoked. She has never used smokeless tobacco. She reports that she does not drink alcohol. No history on file for drug use.   Family History: Family History  Problem Relation Age of Onset  .  Gallbladder disease Mother   . Stroke Father   . Heart disease Father     Review of Systems: Review of Systems  Constitutional: Negative for chills and fever.  HENT: Negative for hearing loss.   Respiratory: Negative for shortness of breath.   Cardiovascular: Negative for chest pain.  Gastrointestinal: Positive for heartburn. Negative for abdominal pain, constipation, diarrhea, nausea and vomiting.  Genitourinary: Negative for dysuria.  Musculoskeletal: Positive for joint pain.  Skin: Negative for rash.  Neurological: Negative for dizziness.  Psychiatric/Behavioral: Negative for depression.    Physical Exam BP 139/76   Pulse 85   Temp (!) 94.5 F (34.7 C)   Resp 14   Ht '4\' 10"'$  (1.473 m)   Wt 199 lb (90.3 kg)   LMP  (LMP Unknown)   SpO2 94%   BMI 41.59 kg/m  CONSTITUTIONAL: No acute distress HEENT:  Normocephalic, atraumatic, extraocular motion intact. NECK: Trachea is midline, and there is no jugular venous distension.  RESPIRATORY:  Lungs are clear, and breath sounds are equal bilaterally. Normal respiratory effort without pathologic use of accessory muscles. CARDIOVASCULAR: Heart is regular without murmurs, gallops, or rubs. GI: The abdomen is soft, obese, non-distended, non-tender to palpation.  The patient has an incisional hernia around the umbilicus, which is reducible.  She also has a large parastomal hernia which feels reducible without tenderness.  The ostomy itself has pink mucosa, non-edematous.  MUSCULOSKELETAL:  Normal muscle strength and tone in all four extremities.  No peripheral edema or cyanosis. SKIN: Skin turgor is normal. There are no pathologic skin lesions.  NEUROLOGIC:  Motor and sensation is grossly normal.  Cranial nerves are grossly intact. PSYCH:  Alert and oriented to person, place and time. Affect is normal.  Laboratory Analysis: Labs from 02/18/20: Na 138, K 5.4, Cl 107, CO2 23, BUN 13, Cr 1.09.  LFTs within normal.  WBC 9.4, Hgb 11.7, Hct  36.4, Plt 443.  Imaging: CT scan abd/pelvis w/o contrast 02/18/20: IMPRESSION: 1. Left-sided obstructive uropathy related to punctate less than 2 mm calculus at the left UVJ. 2. Complex 1.7 cm left upper pole renal lesion, consistent with a complex cyst, and dedicated renal MRI is recommended on a nonemergent outpatient basis for complete characterization. 3. Large hiatal hernia. 4. 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 5. Midline umbilical hernia contains a loop of small bowel, with no incarceration or obstruction.  Assessment and Plan: This is a 79 y.o. female s/p sigmoidectomy and end colostomy for perforated diverticulitis, as well as incisional and parastomal hernias.  --Discussed with the patient that given the number of surgeries, as well as the two difficult surgeries she had for her diverticulitis and the anastomotic leak/sepsis, it would be best to proceed with colostomy reversal via open surgery rather than trying this laparoscopically.  Discussed with her my plan for open colostomy reversal, incisional hernia repair, and parastomal hernia repair.  Discussed with her the risks of bleeding, infection, injury to surrounding structures, and the possibility of a diverting ostomy.  Discussed post-operative course,  restrictions, and office follow up.  Discussed with her bowel prep prior to surgery, COVID testing prior to surgery (has not been vaccinated).  She's in agreement and wishes to proceed. --We'll schedule tentatively for 04/21/20 in the afternoon.   --Will send medical clearance form. --She was instructed to stop her Aspirin 5 days prior to surgery.  Face-to-face time spent with the patient and care providers was 60 minutes, with more than 50% of the time spent counseling, educating, and coordinating care of the patient.     Melvyn Neth, [facility]

## 2020-04-06 NOTE — H&P (View-Only) (Signed)
04/06/2020  Reason for Visit:  Colostomy in place  Referring Provider:  Lucilla Lame, MD  History of Present Illness: Nancy Hickman is a 79 y.o. female presenting for evaluation for colostomy reversal.  The patient moved from [place] about a year ago.  She was hospitalized there in August 2019 with perforated diverticulitis and underwent first a sigmoidectomy with primary anastomosis, but required takeback for Hartmann's type surgery due to anastomotic leak.  She was hospitalized for about 6 weeks.  She was getting prepared for colostomy reversal last year, but had to be cancelled as the COVID-19 pandemic restrictions were starting to be placed.  Patient reports that she deals well with the ostomy and has normal stool, with sometimes occasional specks of blood.  She reports some bulging around his abdomen and around the ostomy, but denies any significant pain.  More recently, she presented to the ED for abdominal pain and kidney stone and had a CT scan without contrast which showed a small/medium incisional midline hernia around the umbilicus, as well as a large parastomal hernia containing small bowel.  She was not aware of the hernias, but she does know of a large hiatal hernia that she has had for many years.  The patient overall is functional and does her own chores.  She has arthritis which sometimes limits her mobility, but otherwise walks without assistance most of the time, without any chest pain or dyspnea.  Her hiatal hernia she reports is pushing against part of her lung.  Of notes, she also has a history of open cholecystectomy, appendectomy, and trauma splenectomy.  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  Past Medical History: Past Medical History:  Diagnosis Date  . A-fib ([place])   . Cancer ([place])    breast  . Diabetes mellitus without complication ([place])   . Dysrhythmia   . Myocardial infarction [facility]) 12/2007     Past Surgical  History: Past Surgical History:  Procedure Laterality Date  . ablation for atrial fibrillation  2009  . APPENDECTOMY    . BREAST SURGERY    . CHOLECYSTECTOMY    . COLECTOMY     due to diverticulitis/ 12 inches of colon removed  . COLONOSCOPY WITH PROPOFOL N/A 10/16/2019   Procedure: COLONOSCOPY WITH PROPOFOL;  Surgeon: Lucilla Lame, MD;  Location: [facility] ENDOSCOPY;  Service: Endoscopy;  Laterality: N/A;  . ESOPHAGOGASTRODUODENOSCOPY (EGD) WITH PROPOFOL N/A 10/16/2019   Procedure: ESOPHAGOGASTRODUODENOSCOPY (EGD) WITH PROPOFOL;  Surgeon: Lucilla Lame, MD;  Location: [facility] ENDOSCOPY;  Service: Endoscopy;  Laterality: N/A;  . spleen removal    . TUMOR REMOVAL  2014   Breast cancer    Home Medications: Prior to Admission medications   Medication Sig Start Date End Date Taking? Authorizing Provider  aspirin EC 81 MG tablet Take 81 mg by mouth daily. Swallow whole.   Yes [provider]  Blood Glucose Monitoring Suppl (ONE TOUCH ULTRA 2) w/Device KIT  06/19/19  Yes [provider]  fluticasone Asencion Islam) 50 MCG/ACT nasal spray  06/18/19  Yes [provider]  Lancets (ONETOUCH DELICA PLUS [id number]) [place]  06/19/19  Yes [provider]  metoprolol succinate (TOPROL-XL) 25 MG 24 hr tablet Take 25 mg by mouth daily. 06/18/19  Yes [provider]  omeprazole (PRILOSEC) 20 MG capsule Take 20 mg by mouth daily. 06/18/19  Yes [provider]  traMADol (ULTRAM) 50 MG tablet Take 1 tablet (50 mg total) by mouth every 6 (six) hours as needed. 02/18/20  02/17/21 Yes Phineas Semen, MD    Allergies: Allergies  Allergen Reactions  . Doxycycline   . Lorcet Plus [Hydrocodone-Acetaminophen]   . Penicillins   . Statins     Social History:  reports that she has never smoked. She has never used smokeless tobacco. She reports that she does not drink alcohol. No history on file for drug use.   Family History: Family History  Problem Relation Age of Onset  .  Gallbladder disease Mother   . Stroke Father   . Heart disease Father     Review of Systems: Review of Systems  Constitutional: Negative for chills and fever.  HENT: Negative for hearing loss.   Respiratory: Negative for shortness of breath.   Cardiovascular: Negative for chest pain.  Gastrointestinal: Positive for heartburn. Negative for abdominal pain, constipation, diarrhea, nausea and vomiting.  Genitourinary: Negative for dysuria.  Musculoskeletal: Positive for joint pain.  Skin: Negative for rash.  Neurological: Negative for dizziness.  Psychiatric/Behavioral: Negative for depression.    Physical Exam BP 139/76   Pulse 85   Temp (!) 94.5 F (34.7 C)   Resp 14   Ht 4\' 10"  (1.473 m)   Wt 199 lb (90.3 kg)   LMP  (LMP Unknown)   SpO2 94%   BMI 41.59 kg/m  CONSTITUTIONAL: No acute distress HEENT:  Normocephalic, atraumatic, extraocular motion intact. NECK: Trachea is midline, and there is no jugular venous distension.  RESPIRATORY:  Lungs are clear, and breath sounds are equal bilaterally. Normal respiratory effort without pathologic use of accessory muscles. CARDIOVASCULAR: Heart is regular without murmurs, gallops, or rubs. GI: The abdomen is soft, obese, non-distended, non-tender to palpation.  The patient has an incisional hernia around the umbilicus, which is reducible.  She also has a large parastomal hernia which feels reducible without tenderness.  The ostomy itself has pink mucosa, non-edematous.  MUSCULOSKELETAL:  Normal muscle strength and tone in all four extremities.  No peripheral edema or cyanosis. SKIN: Skin turgor is normal. There are no pathologic skin lesions.  NEUROLOGIC:  Motor and sensation is grossly normal.  Cranial nerves are grossly intact. PSYCH:  Alert and oriented to person, place and time. Affect is normal.  Laboratory Analysis: Labs from 02/18/20: Na 138, K 5.4, Cl 107, CO2 23, BUN 13, Cr 1.09.  LFTs within normal.  WBC 9.4, Hgb 11.7, Hct  36.4, Plt 443.  Imaging: CT scan abd/pelvis w/o contrast 02/18/20: IMPRESSION: 1. Left-sided obstructive uropathy related to punctate less than 2 mm calculus at the left UVJ. 2. Complex 1.7 cm left upper pole renal lesion, consistent with a complex cyst, and dedicated renal MRI is recommended on a nonemergent outpatient basis for complete characterization. 3. Large hiatal hernia. 4. 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 5. Midline umbilical hernia contains a loop of small bowel, with no incarceration or obstruction.  Assessment and Plan: This is a 79 y.o. female s/p sigmoidectomy and end colostomy for perforated diverticulitis, as well as incisional and parastomal hernias.  --Discussed with the patient that given the number of surgeries, as well as the two difficult surgeries she had for her diverticulitis and the anastomotic leak/sepsis, it would be best to proceed with colostomy reversal via open surgery rather than trying this laparoscopically.  Discussed with her my plan for open colostomy reversal, incisional hernia repair, and parastomal hernia repair.  Discussed with her the risks of bleeding, infection, injury to surrounding structures, and the possibility of a diverting ostomy.  Discussed post-operative course,  restrictions, and office follow up.  Discussed with her bowel prep prior to surgery, COVID testing prior to surgery (has not been vaccinated).  She's in agreement and wishes to proceed. --We'll schedule tentatively for 04/21/20 in the afternoon.   --Will send medical clearance form. --She was instructed to stop her Aspirin 5 days prior to surgery.  Face-to-face time spent with the patient and care providers was 60 minutes, with more than 50% of the time spent counseling, educating, and coordinating care of the patient.     Melvyn Neth, [facility]

## 2020-04-06 NOTE — Telephone Encounter (Signed)
Medical clearance form faxed to Dr Rebeca Alert.   G:[phone] X:[phone]

## 2020-04-07 ENCOUNTER — Telehealth: Payer: Self-pay | Admitting: Surgery

## 2020-04-07 NOTE — Telephone Encounter (Signed)
Pt has been advised of Pre-Admission date/time, COVID Testing date and Surgery date.  Surgery Date: 04/28/20 Preadmission Testing Date: 04/21/20 (phone 8a-1p) Covid Testing Date: 04/26/20 - patient advised to go to the [facility] ([place]) between 8a-1p   Patient has been made aware to call [phone], between 1-3:00pm the day before surgery, to find out what time to arrive for surgery.

## 2020-04-13 ENCOUNTER — Ambulatory Visit: Payer: Medicare Other | Admitting: Surgery

## 2020-04-21 ENCOUNTER — Telehealth: Payer: Self-pay | Admitting: Emergency Medicine

## 2020-04-21 ENCOUNTER — Other Ambulatory Visit: Admission: RE | Admit: 2020-04-21 | Payer: Medicare Other | Source: Ambulatory Visit

## 2020-04-21 NOTE — Telephone Encounter (Signed)
Called to Dr Hilaria Ota office in regards to clearance. Spoke to Nancy Hickman, she states pt has to go in for an office visit. Pt is scheduled for 04/25/20. Nancy Hickman if she can fax forms after appt due to pt sx date 04/28/20. Nancy Hickman voiced understanding.

## 2020-04-25 ENCOUNTER — Encounter: Payer: Self-pay | Admitting: Emergency Medicine

## 2020-04-25 ENCOUNTER — Encounter
Admission: RE | Admit: 2020-04-25 | Discharge: 2020-04-25 | Disposition: A | Discharge status: Home / Self Care | Payer: Medicare Other | Source: Ambulatory Visit | Attending: Surgery | Admitting: Surgery

## 2020-04-25 ENCOUNTER — Other Ambulatory Visit: Payer: Self-pay

## 2020-04-25 HISTORY — DX: Personal history of urinary calculi: Z87.442

## 2020-04-25 HISTORY — DX: Gastro-esophageal reflux disease without esophagitis: K21.9

## 2020-04-25 HISTORY — DX: Anemia, unspecified: D64.9

## 2020-04-25 HISTORY — DX: Diverticulitis of intestine, part unspecified, without perforation or abscess without bleeding: K57.92

## 2020-04-25 HISTORY — DX: Personal history of other diseases of the digestive system: Z87.19

## 2020-04-25 HISTORY — DX: Pneumonia, unspecified organism: J18.9

## 2020-04-25 NOTE — Patient Instructions (Addendum)
COVID TESTING Date: Tuesday, July 20 Testing site:  [facility] [address] Thru Hours:  1:96 am - 1:00 pm Once you are tested, you are asked to stay quarantined (avoiding public places) until after your surgery.   Your procedure is scheduled on: Thursday, July 22 Report to Day Surgery on the 2nd floor of the [facility]. To find out your arrival time, please call [phone] between 1PM - 3PM on: Wednesday, July 21  REMEMBER: Instructions that are not followed completely may result in serious medical risk, up to and including death; or upon the discretion of your surgeon and anesthesiologist your surgery may need to be rescheduled.  Do not eat food after midnight the night before surgery.  No gum chewing, lozengers or hard candies.  You may however, drink water up to 2 hours before you are scheduled to arrive for your surgery. Do not drink anything within 2 hours of your scheduled arrival time.  TAKE THESE MEDICATIONS THE MORNING OF SURGERY WITH A SIP OF WATER:  1.  Omeprazole  Follow recommendations from Cardiologist, Pulmonologist or PCP regarding stopping Aspirin. Stop 5 days prior to surgery per surgeon.  Stop Anti-inflammatories (NSAIDS) such as Advil, Aleve, Ibuprofen, Motrin, Naproxen, Naprosyn and Aspirin based products such as Excedrin, Goodys Powder, BC Powder. (May take Tylenol or Acetaminophen if needed.)  Stop ANY OVER THE COUNTER supplements until after surgery.  On the morning of surgery brush your teeth with toothpaste and water, you may rinse your mouth with mouthwash if you wish. Do not swallow any toothpaste or mouthwash.  Do not wear jewelry, make-up, hairpins, clips or nail polish.  Do not wear lotions, powders, or perfumes.   Do not shave 48 hours prior to surgery.   Dentures may not be worn into surgery.  Do not bring valuables to the hospital. [facility] is not responsible for any missing/lost belongings or  valuables.   Use CHG Soap as directed on instruction sheet.  Bowel prep / Fleets enema as directed by your surgeon.  Notify your doctor if there is any change in your medical condition (cold, fever, infection).  Wear comfortable clothing (specific to your surgery type) to the hospital.  After surgery, you can help prevent lung complications by doing breathing exercises.  Take deep breaths and cough every 1-2 hours. Your doctor may order a device called an Incentive Spirometer to help you take deep breaths. When coughing or sneezing, hold a pillow firmly against your incision with both hands. This is called "splinting." Doing this helps protect your incision. It also decreases belly discomfort.  If you are being admitted to the hospital overnight, leave your suitcase in the car. After surgery it may be brought to your room.  Please call the [facility] Dept. at [phone] if you have any questions about these instructions.  Visitation Policy:  Patients undergoing a surgery or procedure may have one family member or support person with them as long as that person is not COVID-19 positive or experiencing its symptoms.  That person may remain in the waiting area during the procedure.  Inpatient Visitation Update:   Two designated support people may visit a patient during visiting hours 7 am to 8 pm. It must be the same two designated people for the duration of the patient stay. The visitors may come and go during the day, and there is no switching out to have different visitors. A mask must be worn at all times, including  in the patient room.  As a reminder, masks are still required for all [place] team members, patients and visitors in all [facility] facilities.   Systemwide, no visitors 17 or younger.

## 2020-04-26 ENCOUNTER — Other Ambulatory Visit: Admission: RE | Admit: 2020-04-26 | Payer: Medicare Other | Source: Ambulatory Visit

## 2020-04-26 ENCOUNTER — Other Ambulatory Visit
Admission: RE | Admit: 2020-04-26 | Discharge: 2020-04-26 | Disposition: A | Discharge status: Home / Self Care | Payer: Medicare Other | Source: Ambulatory Visit | Attending: Surgery | Admitting: Surgery

## 2020-04-26 DIAGNOSIS — Z20822 Contact with and (suspected) exposure to covid-19: Secondary | ICD-10-CM | POA: Insufficient documentation

## 2020-04-26 DIAGNOSIS — Z01818 Encounter for other preprocedural examination: Secondary | ICD-10-CM | POA: Insufficient documentation

## 2020-04-26 LAB — CBC WITH DIFFERENTIAL/PLATELET
Abs Immature Granulocytes: 0.03 10*3/uL (ref 0.00–0.07)
Basophils Absolute: 0.1 10*3/uL (ref 0.0–0.1)
Basophils Relative: 1 %
Eosinophils Absolute: 0.4 10*3/uL (ref 0.0–0.5)
Eosinophils Relative: 6 %
HCT: 37 % (ref 36.0–46.0)
Hemoglobin: 11.8 g/dL — ABNORMAL LOW (ref 12.0–15.0)
Immature Granulocytes: 0 %
Lymphocytes Relative: 36 %
Lymphs Abs: 2.6 10*3/uL (ref 0.7–4.0)
MCH: 27.4 pg (ref 26.0–34.0)
MCHC: 31.9 g/dL (ref 30.0–36.0)
MCV: 85.8 fL (ref 80.0–100.0)
Monocytes Absolute: 0.7 10*3/uL (ref 0.1–1.0)
Monocytes Relative: 9 %
Neutro Abs: 3.5 10*3/uL (ref 1.7–7.7)
Neutrophils Relative %: 48 %
Platelets: 476 10*3/uL — ABNORMAL HIGH (ref 150–400)
RBC: 4.31 MIL/uL (ref 3.87–5.11)
RDW: 15.8 % — ABNORMAL HIGH (ref 11.5–15.5)
WBC: 7.3 10*3/uL (ref 4.0–10.5)
nRBC: 0 % (ref 0.0–0.2)

## 2020-04-26 LAB — COMPREHENSIVE METABOLIC PANEL
ALT: 18 U/L (ref 0–44)
AST: 22 U/L (ref 15–41)
Albumin: 3.9 g/dL (ref 3.5–5.0)
Alkaline Phosphatase: 108 U/L (ref 38–126)
Anion gap: 9 (ref 5–15)
BUN: 16 mg/dL (ref 8–23)
CO2: 25 mmol/L (ref 22–32)
Calcium: 9.2 mg/dL (ref 8.9–10.3)
Chloride: 106 mmol/L (ref 98–111)
Creatinine, Ser: 0.87 mg/dL (ref 0.44–1.00)
GFR calc Af Amer: >60 mL/min (ref >60)
GFR calc non Af Amer: >60 mL/min (ref >60)
Glucose, Bld: 144 mg/dL — ABNORMAL HIGH (ref 70–99)
Potassium: 3.9 mmol/L (ref 3.5–5.1)
Sodium: 140 mmol/L (ref 135–145)
Total Bilirubin: 0.6 mg/dL (ref 0.3–1.2)
Total Protein: 7.9 g/dL (ref 6.5–8.1)

## 2020-04-26 LAB — PROTIME-INR
INR: 1 (ref 0.8–1.2)
Prothrombin Time: 12.5 seconds (ref 11.4–15.2)

## 2020-04-26 LAB — TYPE AND SCREEN
ABO/RH(D): O POS
Antibody Screen: NEGATIVE

## 2020-04-26 LAB — SARS CORONAVIRUS 2 (TAT 6-24 HRS): SARS Coronavirus 2: NEGATIVE

## 2020-04-27 ENCOUNTER — Telehealth: Payer: Self-pay | Admitting: Emergency Medicine

## 2020-04-27 ENCOUNTER — Encounter: Payer: Self-pay | Admitting: Emergency Medicine

## 2020-04-27 MED ORDER — CIPROFLOXACIN IN D5W 400 MG/200ML IV SOLN
400.0000 mg | INTRAVENOUS | Status: AC
  Administered 2020-04-28: 400 mg via INTRAVENOUS

## 2020-04-27 MED ORDER — METRONIDAZOLE IN NACL 5-0.79 MG/ML-% IV SOLN
500.0000 mg | INTRAVENOUS | Status: AC
  Administered 2020-04-28: 500 mg via INTRAVENOUS
  Filled 2020-04-27: qty 100

## 2020-04-27 NOTE — Telephone Encounter (Signed)
Pt calling stating she is having sx with Dr Hampton Abbot tomorrow. Pt states she started the bowel prep for tomorrow and when taking both antibiotics (neomycin and erythromyin) she started vomiting. Pt denies fever, chills, pain only vomiting.  Spoke to Dr Hampton Abbot, patient is to stop taking antibiotics and to only do miralax prep. Advised to send Zofran to pharmacy.  Spoke to patient and she does not want to cancel surgery. Pt agrees to stop antibiotics but to continue with miralax. Pt denies Zofran states she has some at home. Advised pt to get some ginger ale to help settle stomach. Pt agrees and will call the office if she has any questions or concerns.

## 2020-04-28 ENCOUNTER — Inpatient Hospital Stay: Payer: Medicare Other | Admitting: Anesthesiology

## 2020-04-28 ENCOUNTER — Encounter
Admission: RE | Disposition: A | Discharge status: Home / Self Care | Payer: Self-pay | Source: Home / Self Care | Attending: Surgery

## 2020-04-28 ENCOUNTER — Inpatient Hospital Stay
Admission: RE | Admit: 2020-04-28 | Discharge: 2020-05-04 | DRG: 330 | Disposition: A | Discharge status: Home Health | Payer: Medicare Other | Attending: Surgery | Admitting: Surgery

## 2020-04-28 ENCOUNTER — Other Ambulatory Visit: Payer: Self-pay

## 2020-04-28 ENCOUNTER — Encounter: Payer: Self-pay | Admitting: Surgery

## 2020-04-28 DIAGNOSIS — Z8249 Family history of ischemic heart disease and other diseases of the circulatory system: Secondary | ICD-10-CM | POA: Diagnosis not present

## 2020-04-28 DIAGNOSIS — Z433 Encounter for attention to colostomy: Principal | ICD-10-CM

## 2020-04-28 DIAGNOSIS — Z853 Personal history of malignant neoplasm of breast: Secondary | ICD-10-CM

## 2020-04-28 DIAGNOSIS — Z7982 Long term (current) use of aspirin: Secondary | ICD-10-CM

## 2020-04-28 DIAGNOSIS — K435 Parastomal hernia without obstruction or  gangrene: Secondary | ICD-10-CM | POA: Diagnosis present

## 2020-04-28 DIAGNOSIS — Z933 Colostomy status: Secondary | ICD-10-CM | POA: Diagnosis not present

## 2020-04-28 DIAGNOSIS — Z823 Family history of stroke: Secondary | ICD-10-CM

## 2020-04-28 DIAGNOSIS — E119 Type 2 diabetes mellitus without complications: Secondary | ICD-10-CM | POA: Diagnosis present

## 2020-04-28 DIAGNOSIS — K449 Diaphragmatic hernia without obstruction or gangrene: Secondary | ICD-10-CM | POA: Diagnosis present

## 2020-04-28 DIAGNOSIS — K9171 Accidental puncture and laceration of a digestive system organ or structure during a digestive system procedure: Secondary | ICD-10-CM | POA: Diagnosis not present

## 2020-04-28 DIAGNOSIS — Z79899 Other long term (current) drug therapy: Secondary | ICD-10-CM

## 2020-04-28 DIAGNOSIS — Z20822 Contact with and (suspected) exposure to covid-19: Secondary | ICD-10-CM | POA: Diagnosis present

## 2020-04-28 DIAGNOSIS — I252 Old myocardial infarction: Secondary | ICD-10-CM | POA: Diagnosis not present

## 2020-04-28 DIAGNOSIS — K660 Peritoneal adhesions (postprocedural) (postinfection): Secondary | ICD-10-CM | POA: Diagnosis present

## 2020-04-28 DIAGNOSIS — I4891 Unspecified atrial fibrillation: Secondary | ICD-10-CM | POA: Diagnosis present

## 2020-04-28 DIAGNOSIS — Z9081 Acquired absence of spleen: Secondary | ICD-10-CM

## 2020-04-28 DIAGNOSIS — K432 Incisional hernia without obstruction or gangrene: Secondary | ICD-10-CM

## 2020-04-28 DIAGNOSIS — R1031 Right lower quadrant pain: Secondary | ICD-10-CM | POA: Diagnosis not present

## 2020-04-28 DIAGNOSIS — M1990 Unspecified osteoarthritis, unspecified site: Secondary | ICD-10-CM | POA: Diagnosis present

## 2020-04-28 HISTORY — PX: VENTRAL HERNIA REPAIR: SHX424

## 2020-04-28 HISTORY — PX: PARASTOMAL HERNIA REPAIR: SHX2162

## 2020-04-28 HISTORY — PX: DIVERTING ILEOSTOMY: SHX5799

## 2020-04-28 HISTORY — PX: COLOSTOMY REVERSAL: SHX5782

## 2020-04-28 LAB — ABO/RH: ABO/RH(D): O POS

## 2020-04-28 LAB — GLUCOSE, CAPILLARY
Glucose-Capillary: 151 mg/dL — ABNORMAL HIGH (ref 70–99)
Glucose-Capillary: 158 mg/dL — ABNORMAL HIGH (ref 70–99)

## 2020-04-28 SURGERY — COLOSTOMY REVERSAL
Anesthesia: General

## 2020-04-28 MED ORDER — BUPIVACAINE-EPINEPHRINE (PF) 0.25% -1:200000 IJ SOLN
INTRAMUSCULAR | Status: AC
  Filled 2020-04-28: qty 30

## 2020-04-28 MED ORDER — FENTANYL CITRATE (PF) 100 MCG/2ML IJ SOLN
INTRAMUSCULAR | Status: DC | PRN
  Administered 2020-04-28: 100 ug via INTRAVENOUS
  Administered 2020-04-28: 50 ug via INTRAVENOUS
  Administered 2020-04-28 (×2): 100 ug via INTRAVENOUS

## 2020-04-28 MED ORDER — SODIUM CHLORIDE (PF) 0.9 % IJ SOLN
INTRAMUSCULAR | Status: AC
  Filled 2020-04-28: qty 50

## 2020-04-28 MED ORDER — METRONIDAZOLE IN NACL 5-0.79 MG/ML-% IV SOLN
500.0000 mg | Freq: Three times a day (TID) | INTRAVENOUS | Status: AC
  Administered 2020-04-28 – 2020-04-30 (×6): 500 mg via INTRAVENOUS
  Filled 2020-04-28 (×6): qty 100

## 2020-04-28 MED ORDER — ACETAMINOPHEN 500 MG PO TABS
ORAL_TABLET | ORAL | Status: AC
  Administered 2020-04-28: 1000 mg via ORAL
  Filled 2020-04-28: qty 2

## 2020-04-28 MED ORDER — HYDROMORPHONE HCL 1 MG/ML IJ SOLN
INTRAMUSCULAR | Status: AC
  Filled 2020-04-28: qty 1

## 2020-04-28 MED ORDER — ALVIMOPAN 12 MG PO CAPS
ORAL_CAPSULE | ORAL | Status: AC
  Administered 2020-04-28: 12 mg via ORAL
  Filled 2020-04-28: qty 1

## 2020-04-28 MED ORDER — SUGAMMADEX SODIUM 200 MG/2ML IV SOLN
INTRAVENOUS | Status: DC | PRN
  Administered 2020-04-28 (×2): 100 mg via INTRAVENOUS

## 2020-04-28 MED ORDER — LACTATED RINGERS IV SOLN: INTRAVENOUS | Status: DC

## 2020-04-28 MED ORDER — FENTANYL CITRATE (PF) 100 MCG/2ML IJ SOLN
INTRAMUSCULAR | Status: AC
  Filled 2020-04-28: qty 2

## 2020-04-28 MED ORDER — FLUTICASONE PROPIONATE 50 MCG/ACT NA SUSP
1.0000 | Freq: Every day | NASAL | Status: DC | PRN
  Filled 2020-04-28: qty 16

## 2020-04-28 MED ORDER — FENTANYL CITRATE (PF) 100 MCG/2ML IJ SOLN: 25.0000 ug | INTRAMUSCULAR | Status: DC | PRN

## 2020-04-28 MED ORDER — BUPIVACAINE LIPOSOME 1.3 % IJ SUSP
INTRAMUSCULAR | Status: AC
  Filled 2020-04-28: qty 20

## 2020-04-28 MED ORDER — ONDANSETRON 4 MG PO TBDP: 4.0000 mg | ORAL_TABLET | Freq: Four times a day (QID) | ORAL | Status: DC | PRN

## 2020-04-28 MED ORDER — LIDOCAINE HCL (PF) 2 % IJ SOLN
INTRAMUSCULAR | Status: AC
  Filled 2020-04-28: qty 20

## 2020-04-28 MED ORDER — ONDANSETRON HCL 4 MG/2ML IJ SOLN
INTRAMUSCULAR | Status: DC | PRN
  Administered 2020-04-28: 4 mg via INTRAVENOUS

## 2020-04-28 MED ORDER — OXYCODONE HCL 5 MG PO TABS
5.0000 mg | ORAL_TABLET | ORAL | Status: DC | PRN
  Administered 2020-05-04: 10 mg via ORAL
  Filled 2020-04-28 (×2): qty 2

## 2020-04-28 MED ORDER — CHLORHEXIDINE GLUCONATE CLOTH 2 % EX PADS
6.0000 | MEDICATED_PAD | Freq: Once | CUTANEOUS | Status: AC
  Administered 2020-04-28: 6 via TOPICAL

## 2020-04-28 MED ORDER — ONDANSETRON HCL 4 MG/2ML IJ SOLN
INTRAMUSCULAR | Status: AC
  Filled 2020-04-28: qty 2

## 2020-04-28 MED ORDER — DEXAMETHASONE SODIUM PHOSPHATE 10 MG/ML IJ SOLN
INTRAMUSCULAR | Status: AC
  Filled 2020-04-28: qty 1

## 2020-04-28 MED ORDER — LABETALOL HCL 5 MG/ML IV SOLN
INTRAVENOUS | Status: DC | PRN
  Administered 2020-04-28 (×2): 2.5 mg via INTRAVENOUS

## 2020-04-28 MED ORDER — EPHEDRINE 5 MG/ML INJ
INTRAVENOUS | Status: AC
  Filled 2020-04-28: qty 10

## 2020-04-28 MED ORDER — GABAPENTIN 100 MG PO CAPS
100.0000 mg | ORAL_CAPSULE | ORAL | Status: AC
  Administered 2020-04-28: 100 mg via ORAL
  Filled 2020-04-28 (×2): qty 1

## 2020-04-28 MED ORDER — SODIUM CHLORIDE 0.9 % IV SOLN
INTRAVENOUS | Status: DC | PRN
  Administered 2020-04-28: 100 mL

## 2020-04-28 MED ORDER — PANTOPRAZOLE SODIUM 40 MG IV SOLR
40.0000 mg | Freq: Every day | INTRAVENOUS | Status: DC
  Administered 2020-04-28 – 2020-05-03 (×6): 40 mg via INTRAVENOUS
  Filled 2020-04-28 (×6): qty 40

## 2020-04-28 MED ORDER — ACETAMINOPHEN 160 MG/5ML PO SOLN
325.0000 mg | ORAL | Status: DC | PRN
  Filled 2020-04-28: qty 20.3

## 2020-04-28 MED ORDER — CIPROFLOXACIN IN D5W 400 MG/200ML IV SOLN
INTRAVENOUS | Status: AC
  Filled 2020-04-28: qty 200

## 2020-04-28 MED ORDER — PROPOFOL 10 MG/ML IV BOLUS
INTRAVENOUS | Status: DC | PRN
  Administered 2020-04-28: 100 mg via INTRAVENOUS

## 2020-04-28 MED ORDER — DROPERIDOL 2.5 MG/ML IJ SOLN
0.6250 mg | Freq: Once | INTRAMUSCULAR | Status: DC | PRN
  Filled 2020-04-28: qty 2

## 2020-04-28 MED ORDER — POLYETHYLENE GLYCOL 3350 17 G PO PACK: 17.0000 g | PACK | Freq: Every day | ORAL | Status: DC | PRN

## 2020-04-28 MED ORDER — SUCCINYLCHOLINE CHLORIDE 20 MG/ML IJ SOLN
INTRAMUSCULAR | Status: DC | PRN
  Administered 2020-04-28: 10 mg via INTRAVENOUS

## 2020-04-28 MED ORDER — FLEET ENEMA 7-19 GM/118ML RE ENEM: 1.0000 | ENEMA | Freq: Once | RECTAL | Status: DC

## 2020-04-28 MED ORDER — ONDANSETRON HCL 4 MG/2ML IJ SOLN
4.0000 mg | Freq: Once | INTRAMUSCULAR | Status: AC
  Administered 2020-04-28: 4 mg via INTRAVENOUS

## 2020-04-28 MED ORDER — ONDANSETRON HCL 4 MG/2ML IJ SOLN: 4.0000 mg | Freq: Once | INTRAMUSCULAR | Status: DC | PRN

## 2020-04-28 MED ORDER — ENOXAPARIN SODIUM 40 MG/0.4ML ~~LOC~~ SOLN
40.0000 mg | SUBCUTANEOUS | Status: DC
  Administered 2020-04-29 – 2020-05-04 (×6): 40 mg via SUBCUTANEOUS
  Filled 2020-04-28 (×6): qty 0.4

## 2020-04-28 MED ORDER — DEXMEDETOMIDINE HCL IN NACL 80 MCG/20ML IV SOLN
INTRAVENOUS | Status: AC
  Filled 2020-04-28: qty 20

## 2020-04-28 MED ORDER — HYDROMORPHONE HCL 1 MG/ML IJ SOLN
INTRAMUSCULAR | Status: DC | PRN
  Administered 2020-04-28: .5 mg via INTRAVENOUS

## 2020-04-28 MED ORDER — ACETAMINOPHEN 500 MG PO TABS
1000.0000 mg | ORAL_TABLET | Freq: Four times a day (QID) | ORAL | Status: DC
  Administered 2020-04-28 – 2020-05-04 (×17): 1000 mg via ORAL
  Filled 2020-04-28 (×20): qty 2

## 2020-04-28 MED ORDER — CIPROFLOXACIN IN D5W 400 MG/200ML IV SOLN
400.0000 mg | Freq: Two times a day (BID) | INTRAVENOUS | Status: AC
  Administered 2020-04-28 – 2020-04-30 (×4): 400 mg via INTRAVENOUS
  Filled 2020-04-28 (×4): qty 200

## 2020-04-28 MED ORDER — METOPROLOL SUCCINATE ER 25 MG PO TB24
25.0000 mg | ORAL_TABLET | Freq: Every evening | ORAL | Status: DC
  Administered 2020-04-28 – 2020-05-03 (×6): 25 mg via ORAL
  Filled 2020-04-28 (×6): qty 1

## 2020-04-28 MED ORDER — CHLORHEXIDINE GLUCONATE 0.12 % MT SOLN: 15.0000 mL | Freq: Once | OROMUCOSAL | Status: AC

## 2020-04-28 MED ORDER — KETOROLAC TROMETHAMINE 15 MG/ML IJ SOLN
15.0000 mg | Freq: Once | INTRAMUSCULAR | Status: AC
  Administered 2020-04-28: 15 mg via INTRAVENOUS
  Filled 2020-04-28: qty 1

## 2020-04-28 MED ORDER — ACETAMINOPHEN 325 MG PO TABS: 325.0000 mg | ORAL_TABLET | ORAL | Status: DC | PRN

## 2020-04-28 MED ORDER — DEXAMETHASONE SODIUM PHOSPHATE 10 MG/ML IJ SOLN
INTRAMUSCULAR | Status: DC | PRN
  Administered 2020-04-28: 5 mg via INTRAVENOUS

## 2020-04-28 MED ORDER — LIDOCAINE HCL (PF) 2 % IJ SOLN
INTRAMUSCULAR | Status: DC | PRN
  Administered 2020-04-28: 1.155 mg/kg/h via INTRADERMAL

## 2020-04-28 MED ORDER — ALVIMOPAN 12 MG PO CAPS
12.0000 mg | ORAL_CAPSULE | Freq: Two times a day (BID) | ORAL | Status: DC
  Administered 2020-04-29: 12 mg via ORAL
  Filled 2020-04-28 (×2): qty 1

## 2020-04-28 MED ORDER — ORAL CARE MOUTH RINSE: 15.0000 mL | Freq: Once | OROMUCOSAL | Status: AC

## 2020-04-28 MED ORDER — ACETAMINOPHEN 500 MG PO TABS: 1000.0000 mg | ORAL_TABLET | ORAL | Status: AC

## 2020-04-28 MED ORDER — KETOROLAC TROMETHAMINE 15 MG/ML IJ SOLN
15.0000 mg | Freq: Four times a day (QID) | INTRAMUSCULAR | Status: AC
  Administered 2020-04-29 – 2020-05-03 (×20): 15 mg via INTRAVENOUS
  Filled 2020-04-28 (×20): qty 1

## 2020-04-28 MED ORDER — FENTANYL CITRATE (PF) 250 MCG/5ML IJ SOLN
INTRAMUSCULAR | Status: AC
  Filled 2020-04-28: qty 5

## 2020-04-28 MED ORDER — LIDOCAINE HCL (CARDIAC) PF 100 MG/5ML IV SOSY
PREFILLED_SYRINGE | INTRAVENOUS | Status: DC | PRN
  Administered 2020-04-28: 100 mg via INTRAVENOUS

## 2020-04-28 MED ORDER — HYDROMORPHONE HCL 1 MG/ML IJ SOLN
0.5000 mg | INTRAMUSCULAR | Status: DC | PRN
  Administered 2020-05-01: 0.5 mg via INTRAVENOUS
  Filled 2020-04-28: qty 0.5

## 2020-04-28 MED ORDER — ALVIMOPAN 12 MG PO CAPS: 12.0000 mg | ORAL_CAPSULE | ORAL | Status: AC

## 2020-04-28 MED ORDER — BUPIVACAINE LIPOSOME 1.3 % IJ SUSP: 20.0000 mL | Freq: Once | INTRAMUSCULAR | Status: DC

## 2020-04-28 MED ORDER — ONDANSETRON HCL 4 MG/2ML IJ SOLN
4.0000 mg | Freq: Four times a day (QID) | INTRAMUSCULAR | Status: DC | PRN
  Administered 2020-04-30 – 2020-05-01 (×2): 4 mg via INTRAVENOUS
  Filled 2020-04-28 (×2): qty 2

## 2020-04-28 MED ORDER — VITAMIN B-12 1000 MCG PO TABS
5000.0000 ug | ORAL_TABLET | Freq: Every day | ORAL | Status: DC
  Administered 2020-04-29 – 2020-05-03 (×3): 5000 ug via ORAL
  Filled 2020-04-28 (×6): qty 5

## 2020-04-28 MED ORDER — SODIUM CHLORIDE 0.9 % IV SOLN: INTRAVENOUS | Status: DC

## 2020-04-28 MED ORDER — EPHEDRINE SULFATE 50 MG/ML IJ SOLN
INTRAMUSCULAR | Status: DC | PRN
  Administered 2020-04-28 (×3): 10 mg via INTRAVENOUS

## 2020-04-28 MED ORDER — LABETALOL HCL 5 MG/ML IV SOLN
INTRAVENOUS | Status: AC
  Filled 2020-04-28: qty 4

## 2020-04-28 MED ORDER — PHENYLEPHRINE HCL (PRESSORS) 10 MG/ML IV SOLN
INTRAVENOUS | Status: DC | PRN
  Administered 2020-04-28 (×9): 100 ug via INTRAVENOUS
  Administered 2020-04-28 (×2): 200 ug via INTRAVENOUS

## 2020-04-28 MED ORDER — CHLORHEXIDINE GLUCONATE 0.12 % MT SOLN
OROMUCOSAL | Status: AC
  Administered 2020-04-28: 15 mL via OROMUCOSAL
  Filled 2020-04-28: qty 15

## 2020-04-28 MED ORDER — ROCURONIUM BROMIDE 100 MG/10ML IV SOLN
INTRAVENOUS | Status: DC | PRN
  Administered 2020-04-28: 10 mg via INTRAVENOUS
  Administered 2020-04-28: 50 mg via INTRAVENOUS

## 2020-04-28 MED ORDER — VITAMIN D 25 MCG (1000 UNIT) PO TABS
4000.0000 [IU] | ORAL_TABLET | Freq: Every day | ORAL | Status: DC
  Administered 2020-04-29 – 2020-05-03 (×3): 4000 [IU] via ORAL
  Filled 2020-04-28 (×6): qty 4

## 2020-04-28 SURGICAL SUPPLY — 104 items
APPLIER CLIP 11 MED OPEN (CLIP)
APPLIER CLIP 13 LRG OPEN (CLIP)
BLADE CLIPPER SURG (BLADE) ×4 IMPLANT
BNDG CONFORM 2 STRL LF (GAUZE/BANDAGES/DRESSINGS) ×4 IMPLANT
BRUSH SCRUB EZ  4% CHG (MISCELLANEOUS)
BRUSH SCRUB EZ 4% CHG (MISCELLANEOUS) ×2 IMPLANT
BULB RESERV EVAC DRAIN JP 100C (MISCELLANEOUS) ×2 IMPLANT
CANISTER SUCT 1200ML W/VALVE (MISCELLANEOUS) ×4 IMPLANT
CANISTER WOUND CARE 500ML ATS (WOUND CARE) ×2 IMPLANT
CATH URET ROBINSON 16FR STRL (CATHETERS) IMPLANT
CHLORAPREP W/TINT 26 (MISCELLANEOUS) ×4 IMPLANT
CLIP APPLIE 11 MED OPEN (CLIP) IMPLANT
CLIP APPLIE 13 LRG OPEN (CLIP) IMPLANT
COVER WAND RF STERILE (DRAPES) ×4 IMPLANT
DERMABOND ADVANCED (GAUZE/BANDAGES/DRESSINGS) ×2
DERMABOND ADVANCED .7 DNX12 (GAUZE/BANDAGES/DRESSINGS) ×2 IMPLANT
DRAIN CHANNEL JP 19F (MISCELLANEOUS) ×2 IMPLANT
DRAIN PENROSE 1/4X12 LTX STRL (WOUND CARE) IMPLANT
DRAPE LAPAROTOMY 100X77 ABD (DRAPES) ×4 IMPLANT
DRAPE LEGGINS SURG 28X43 STRL (DRAPES) ×4 IMPLANT
DRAPE UNDER BUTTOCK W/FLU (DRAPES) ×4 IMPLANT
DRSG OPSITE POSTOP 4X10 (GAUZE/BANDAGES/DRESSINGS) IMPLANT
DRSG OPSITE POSTOP 4X6 (GAUZE/BANDAGES/DRESSINGS) ×2 IMPLANT
DRSG OPSITE POSTOP 4X8 (GAUZE/BANDAGES/DRESSINGS) IMPLANT
DRSG TELFA 3X8 NADH (GAUZE/BANDAGES/DRESSINGS) IMPLANT
DRSG TELFA 4X3 1S NADH ST (GAUZE/BANDAGES/DRESSINGS) ×2 IMPLANT
ELECT BLADE 6.5 EXT (BLADE) ×4 IMPLANT
ELECT CAUTERY BLADE 6.4 (BLADE) ×4 IMPLANT
ELECT EZSTD 165MM 6.5IN (MISCELLANEOUS) ×4
ELECT REM PT RETURN 9FT ADLT (ELECTROSURGICAL) ×4
ELECTRODE EZSTD 165MM 6.5IN (MISCELLANEOUS) IMPLANT
ELECTRODE REM PT RTRN 9FT ADLT (ELECTROSURGICAL) ×2 IMPLANT
GAUZE SPONGE 4X4 12PLY STRL (GAUZE/BANDAGES/DRESSINGS) ×2 IMPLANT
GLOVE PROTEXIS LATEX SZ 7.5 (GLOVE) ×16 IMPLANT
GLOVE SURG LATEX 7.5 PF (GLOVE) ×2 IMPLANT
GLOVE SURG SYN 7.0 (GLOVE) ×8 IMPLANT
GLOVE SURG SYN 7.0 PF PI (GLOVE) ×4 IMPLANT
GLOVE SURG SYN 7.5  E (GLOVE) ×4
GLOVE SURG SYN 7.5 E (GLOVE) ×4 IMPLANT
GLOVE SURG SYN 7.5 PF PI (GLOVE) ×4 IMPLANT
GOWN STRL REUS W/ TWL LRG LVL3 (GOWN DISPOSABLE) ×8 IMPLANT
GOWN STRL REUS W/ TWL XL LVL3 (GOWN DISPOSABLE) ×2 IMPLANT
GOWN STRL REUS W/TWL LRG LVL3 (GOWN DISPOSABLE) ×8
GOWN STRL REUS W/TWL XL LVL3 (GOWN DISPOSABLE) ×2
HANDLE SUCTION POOLE (INSTRUMENTS) ×2 IMPLANT
HOLDER FOLEY CATH W/STRAP (MISCELLANEOUS) ×4 IMPLANT
JELLY LUB 2OZ STRL (MISCELLANEOUS) ×2
JELLY LUBE 2OZ STRL (MISCELLANEOUS) ×2 IMPLANT
KIT OSTOMY 2 PC DRNBL 2.25 STR (WOUND CARE) IMPLANT
KIT OSTOMY DRAINABLE 2.25 STR (WOUND CARE) ×2
KIT TURNOVER CYSTO (KITS) ×2 IMPLANT
LABEL OR SOLS (LABEL) ×4 IMPLANT
LIGASURE IMPACT 36 18CM CVD LR (INSTRUMENTS) ×2 IMPLANT
NEEDLE HYPO 22GX1.5 SAFETY (NEEDLE) ×4 IMPLANT
NS IRRIG 1000ML POUR BTL (IV SOLUTION) ×4 IMPLANT
NS IRRIG 500ML POUR BTL (IV SOLUTION) ×4 IMPLANT
PACK BASIN MAJOR (MISCELLANEOUS) ×4 IMPLANT
PACK BASIN MINOR (MISCELLANEOUS) ×2 IMPLANT
PACK COLON CLEAN CLOSURE (MISCELLANEOUS) ×4 IMPLANT
PAD ABD DERMACEA PRESS 5X9 (GAUZE/BANDAGES/DRESSINGS) IMPLANT
PAD DRESSING TELFA 3X8 NADH (GAUZE/BANDAGES/DRESSINGS) ×2 IMPLANT
RELOAD LINEAR CUT PROX 55 BLUE (ENDOMECHANICALS) IMPLANT
RELOAD PROXIMATE 75MM BLUE (ENDOMECHANICALS) IMPLANT
RELOAD STAPLE 55 3.8 BLU REG (ENDOMECHANICALS) IMPLANT
RELOAD STAPLE 75 3.8 BLU REG (ENDOMECHANICALS) IMPLANT
SOL PREP PVP 2OZ (MISCELLANEOUS) ×4
SOLUTION PREP PVP 2OZ (MISCELLANEOUS) ×2 IMPLANT
SPONGE LAP 18X18 RF (DISPOSABLE) ×8 IMPLANT
STAPLER CIRCULAR MANUAL XL 29 (STAPLE) ×2 IMPLANT
STAPLER PROXIMATE 55 BLUE (STAPLE) IMPLANT
STAPLER PROXIMATE 75MM BLUE (STAPLE) IMPLANT
STAPLER SKIN PROX 35W (STAPLE) ×4 IMPLANT
SUCTION POOLE HANDLE (INSTRUMENTS) ×4
SURGILUBE 2OZ TUBE FLIPTOP (MISCELLANEOUS) ×4 IMPLANT
SUT ETHIBOND 0 (SUTURE) ×4 IMPLANT
SUT ETHIBOND 0 MO6 C/R (SUTURE) ×4 IMPLANT
SUT MNCRL AB 4-0 PS2 18 (SUTURE) ×4 IMPLANT
SUT PDS AB 1 CT1 36 (SUTURE) ×12 IMPLANT
SUT PDS PLUS 0 (SUTURE) ×8
SUT PDS PLUS AB 0 CT-2 (SUTURE) ×8 IMPLANT
SUT PROLENE 2 0 SH DA (SUTURE) ×4 IMPLANT
SUT PROLENE 4 0 RB 1 (SUTURE) ×4
SUT PROLENE 4-0 RB1 .5 CRCL 36 (SUTURE) IMPLANT
SUT SILK 0 (SUTURE)
SUT SILK 0 30XBRD TIE 6 (SUTURE) ×2 IMPLANT
SUT SILK 0 CT 1 30 (SUTURE) ×2 IMPLANT
SUT SILK 0 SH 30 (SUTURE) ×2 IMPLANT
SUT SILK 2 0 (SUTURE)
SUT SILK 2 0 SH (SUTURE) ×4 IMPLANT
SUT SILK 2 0 SH CR/8 (SUTURE) ×6 IMPLANT
SUT SILK 2 0SH CR/8 30 (SUTURE) ×2 IMPLANT
SUT SILK 2-0 18XBRD TIE 12 (SUTURE) IMPLANT
SUT SILK 3-0 (SUTURE) ×8 IMPLANT
SUT VIC AB 0 SH 27 (SUTURE) ×4 IMPLANT
SUT VIC AB 2-0 SH 27 (SUTURE)
SUT VIC AB 2-0 SH 27XBRD (SUTURE) IMPLANT
SUT VIC AB 3-0 SH 27 (SUTURE) ×6
SUT VIC AB 3-0 SH 27X BRD (SUTURE) ×6 IMPLANT
SUT VICRYL 3-0 CR8 SH (SUTURE) ×2 IMPLANT
SYR 10ML LL (SYRINGE) ×4 IMPLANT
SYR 20ML LL LF (SYRINGE) ×8 IMPLANT
SYR BULB IRRIG 60ML STRL (SYRINGE) ×4 IMPLANT
SYRINGE IRR TOOMEY STRL 70CC (SYRINGE) ×4 IMPLANT
TRAY FOLEY MTR SLVR 16FR STAT (SET/KITS/TRAYS/PACK) ×4 IMPLANT
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
## 2020-04-28 NOTE — Transfer of Care (Signed)
Immediate Anesthesia Transfer of Care Note  Patient: Nancy Hickman  Procedure(s) Performed: COLOSTOMY REVERSAL, OPEN (N/A ) HERNIA REPAIR VENTRAL ADULT (N/A ) HERNIA REPAIR PARASTOMAL (N/A ) DIVERTING ILEOSTOMY  Patient Location: PACU  Anesthesia Type:General  Level of Consciousness: drowsy  Airway & Oxygen Therapy: Patient Spontanous Breathing and Patient connected to face mask oxygen  Post-op Assessment: Report given to RN and Post -op Vital signs reviewed and stable  Post vital signs: Reviewed and stable  Last Vitals:  Vitals Value Taken Time  BP    Temp    Pulse    Resp    SpO2      Last Pain:  Vitals:   04/28/20 0854  TempSrc: Tympanic  PainSc: 0-No pain         Complications: No complications documented.

## 2020-04-28 NOTE — Op Note (Addendum)
Procedure Date:  04/28/2020  Pre-operative Diagnosis:  Colostomy in place  Post-operative Diagnosis:  Colostomy in place  Procedure:  Open Colostomy takedown with colorectal anastomosis, lysis of adhesions, repair of parastomal hernia, repair of incisional hernia, excision of skin and subcutaneous tissue for 30 cm2, creation of diverting loop ileostomy, wound vac placement < 50 cm.  Surgeon:  Melvyn Neth, MD  Assistant:  Nestor Lewandowsky, MD.  His assistance was critical due to the complexity of the procedure, for appropriate exposure, dissection, anastomosis, and closure.  Also assisting, [employer], PA-S.  Anesthesia:  General endotracheal  Estimated Blood Loss:  50 ml  Specimens:  None  Complications:  Leak from primary colorectal anastomosis, requiring diverting loop ileostomy.  Indications for Procedure:  This is a 79 y.o. female with history of prior sigmoidectomy for diverticulitis, complicated by anastomotic leak requiring Hartman's procedure.  She now presents for colostomy reversal.  Colonoscopy did not reveal any other issues that would preclude surgery. The risks of bleeding, infection, bowel injury, need for diverting ostomy, and need for further procedures were all discussed with the patient and she was willing to proceed.   Description of Procedure: The patient was correctly identified in the preoperative area and brought into the operating room.  The patient was placed supine with VTE prophylaxis in place.  Appropriate time-outs were performed.  Anesthesia was induced and the patient was intubated.  Appropriate antibiotics were infused.  The patient was then placed in lithotomy position.   The end colostomy was sutured closed with a 2-0 Silk suture.  The abdomen was prepped and draped in a sterile fashion.  We started with a midline incision through the previous laparotomy scar, excising the prior stretched scar with underlying subcutaneous tissue for a total of 30 cm.   We then continued with cautery down the subcutaneous tissue to the level of the fascia.  We entered the abdominal cavity through the superior portion of the incision.  The patient was noted to have extensive adhesions to the abdominal wall and there were 2 serosal tears created while trying to free up the small bowel from the overlying fascia.  After careful adhesiolysis, we finally were able to open the entire midline incision including the fascia.  The serosal tears were repaired using 3-0 silk sutures.  There were no true enterotomies.  We then continue with extensive lysis of adhesions of bowel that was adhered to the abdominal wall, omentum that was adhered to the abdominal wall, both that were within the parastomal hernia as well as small bowel adhered to the pelvis.  We are able to run the entirety of the small bowel at the end.  We then focused our attention to the pelvis and after further adhesiolysis particularly of the uterus against the rectum as well as the ovaries to the rectum, we are able to mobilize the poor proximal portion of the rectum with no complications.  We had to use a dilator within the rectal canal in order to truly identify the rectal pouch.  After this was done, we then proceeded to mobilize the end colostomy.  We dissected the colostomy at the level of the mucocutaneous junction and used cautery to dissect the bowel wall and mesentery from the abdominal wall.  Once we reached the level of the fascia, this was freed up as well.  We did encounter the hernia sac from her parastomal hernia and this was excised.  Once the end colostomy was fully mobilized we were able  to push it back into the abdominal cavity.  We did some additional lysis of adhesions of the colon along the white line of [place].  We determined that we had appropriate length for primary anastomosis.  We then used a pursestring clamp to resect the distal 1 cm of the colostomy and put a pursestring suture through the wall.  We  dilated the end of the colon using a 29 dilator which worked very well.  Determined that we would use a 29 size EEA stapler.  We placed the anvil into the end of the colon and tied the pressure in around it.  We freed up additional epiploic fat around the anvil so that there would be no V anastomosis.  Then Dr. Genevive Bi proceeded to go with the EEA stapler transrectally and we were able to deployed the spike with no complications.  The spike and the anvil were joined together and the stapler was closed and fired.  During this process there were no complications.  Once the stapler was out, the 2 anastomotic donuts were intact.  However we noted that on the left anterolateral quadrant of the anastomosis, a enterotomy had been created.  Upon further examination of this, there was a pocket of pus that somehow was included in the anastomosis.  This was then repaired primarily using interrupted 3-0 silk sutures.  We tested this you see a red rubber catheter through the anal canal and instilling air against the clamped distal colon.  There was no bubbles noted during this leak test but given the situation with the previous pocket of pus as well as her previous original surgery which did have anastomotic leak, we decided to be cautious and create a diverting loop ileostomy.  We created a window in the mesentery of the distal ileum at the appropriate point for a loop ileostomy and brought that out through a right lower quadrant incision that was made in the usual fashion.  We then proceeded to irrigate the abdominal cavity very thoroughly in all quadrants and placed a 19 [place] Blake drain via the left lower quadrant going to the pelvis at the level of the anastomosis.  We then proceeded to scrub out and rescrubbed for our clean closure.  100 mL of Exparel solution mixed with half percent bupivacaine with epi and normal saline were infiltrated into the peritoneum, fascia, and subcutaneous tissue of all incisions.  We then  proceeded to close the colostomy incision repairing the parastomal hernia that she had.  This was repaired using #1 PDS suture in both the anterior sheath and posterior sheath layers.  We then proceeded to close the midline incision with #1 PDS suture and in doing so, we also repaired the incisional hernia that the patient had at the level of the umbilicus.  We then closed the midline incision in the left lower quadrant incision in 2 layers using 2-0 Vicryl for deep dermal and then staples for the skin.  The Blake drain was secured to the skin using 3-0 nylon suture.  We then covered the incisions with sterile towel and proceeded to mature the loop ileostomy in standard [place] fashion.  We placed a 14 French red rubber catheter underneath the ostomy to create a bridge so the ostomy would not sink down.  This was cut to size and tied in 3 places using 0 silk sutures.  Once this all was completed, the patient skin was fully cleaned and dried and a Prevena VAC was placed over the midline and  left lower quadrant incisions and connected to suction.  An ostomy appliance was appropriately placed over the loop ileostomy including the bridge inside the bag.  The drain was dressed with 4 x 4 gauze and Tegaderm.  The patient was then placed in flat supine position.  The patient was emerged from anesthesia and extubated and brought to the recovery room for further management.   The patient tolerated the procedure well and all counts were correct at the end of the case.    Melvyn Neth, MD

## 2020-04-28 NOTE — Interval H&P Note (Signed)
History and Physical Interval Note:  04/28/2020 10:11 AM  Nancy Hickman  has presented today for surgery, with the diagnosis of Colostomy in place, incision ventral hernia, parastomal hernia.  The various methods of treatment have been discussed with the patient and family. After consideration of risks, benefits and other options for treatment, the patient has consented to  Procedure(s): COLOSTOMY REVERSAL, OPEN (N/A) HERNIA REPAIR VENTRAL ADULT (N/A) HERNIA REPAIR PARASTOMAL (N/A) as a surgical intervention.  The patient's history has been reviewed, patient examined, no change in status, stable for surgery.  I have reviewed the patient's chart and labs.  Questions were answered to the patient's satisfaction.     [name]

## 2020-04-28 NOTE — Anesthesia Procedure Notes (Signed)
Procedure Name: Intubation Date/Time: 04/28/2020 10:45 AM Performed by: Genevie Ann, CRNA Pre-anesthesia Checklist: Patient identified, Emergency Drugs available, Suction available, Patient being monitored and Timeout performed Patient Re-evaluated:Patient Re-evaluated prior to induction Oxygen Delivery Method: Circle system utilized Preoxygenation: Pre-oxygenation with 100% oxygen Induction Type: IV induction Ventilation: Mask ventilation without difficulty Laryngoscope Size: Mac and 3 Grade View: Grade I Tube type: Oral Tube size: 7.0 mm Number of attempts: 1 Placement Confirmation: ETT inserted through vocal cords under direct vision,  positive ETCO2 and breath sounds checked- equal and bilateral Secured at: 21 cm Dental Injury: Teeth and Oropharynx as per pre-operative assessment

## 2020-04-29 ENCOUNTER — Encounter: Payer: Self-pay | Admitting: Surgery

## 2020-04-29 LAB — CBC
HCT: 32.9 % — ABNORMAL LOW (ref 36.0–46.0)
Hemoglobin: 10.5 g/dL — ABNORMAL LOW (ref 12.0–15.0)
MCH: 27.6 pg (ref 26.0–34.0)
MCHC: 31.9 g/dL (ref 30.0–36.0)
MCV: 86.4 fL (ref 80.0–100.0)
Platelets: 421 10*3/uL — ABNORMAL HIGH (ref 150–400)
RBC: 3.81 MIL/uL — ABNORMAL LOW (ref 3.87–5.11)
RDW: 15.9 % — ABNORMAL HIGH (ref 11.5–15.5)
WBC: 15.2 10*3/uL — ABNORMAL HIGH (ref 4.0–10.5)
nRBC: 0 % (ref 0.0–0.2)

## 2020-04-29 LAB — BASIC METABOLIC PANEL
Anion gap: 8 (ref 5–15)
BUN: 16 mg/dL (ref 8–23)
CO2: 19 mmol/L — ABNORMAL LOW (ref 22–32)
Calcium: 7.9 mg/dL — ABNORMAL LOW (ref 8.9–10.3)
Chloride: 113 mmol/L — ABNORMAL HIGH (ref 98–111)
Creatinine, Ser: 1.28 mg/dL — ABNORMAL HIGH (ref 0.44–1.00)
GFR calc Af Amer: 46 mL/min — ABNORMAL LOW (ref >60)
GFR calc non Af Amer: 40 mL/min — ABNORMAL LOW (ref >60)
Glucose, Bld: 121 mg/dL — ABNORMAL HIGH (ref 70–99)
Potassium: 3.9 mmol/L (ref 3.5–5.1)
Sodium: 140 mmol/L (ref 135–145)

## 2020-04-29 LAB — MAGNESIUM: Magnesium: 1.8 mg/dL (ref 1.7–2.4)

## 2020-04-29 MED ORDER — DIPHENHYDRAMINE HCL 25 MG PO CAPS: 25.0000 mg | ORAL_CAPSULE | Freq: Four times a day (QID) | ORAL | Status: DC | PRN

## 2020-04-29 NOTE — Consult Note (Addendum)
[place] Nurse ostomy consult note Pt is followed by the surgical team and has an incisional Vac in place to her abd post-op wound, please refer to surgical team for further plan of care for this location.   Pt previously had a colostomy and states she was independent with pouch application and emptying prior to admission.  She is using 2 piece cut to fit pouching system and barrier ring and no changes need to be made in her supplies.  Stoma type/location: Ileostomy revision surgery was performed on 7/22.   Stomal assessment/size: Stoma is red and viable, 1 1/4 inches and oval, above skin level with red rubber rod in place Peristomal assessment: intact skin surrounding Output: Mod amt liquid brown stool  Ostomy pouching: Applied 2pc pouching system with barrier ring. 4 extra sets of wafers/barrier rings/pouches left in the room for pt and bedside nurse use.  Educational materials at the bedside.  Reviewed differences in ileostomy output, dietary precautions,  and avoiding dehydration. Daughter at the bedside during the teaching session. Julien Girt MSN, RN, [place], [place], [place]

## 2020-04-29 NOTE — Progress Notes (Signed)
04/29/2020  Subjective: Patient is 1 Day Post-Op s/p open colostomy reversal with diverting loop ileostomy.  No acute events.  Patient reports that she's feeling very well.  Denies any worsening pain, nausea, or vomiting.  Discussed again with her the intraoperative findings, issues with the colorectal anastomosis, and the thought process behind doing a diverting loop ileostomy.  WBC elevated today to 15.2 and Cr to 1.28  Vital signs: Temp:  [97.8 F (36.6 C)-100 F (37.8 C)] 98.5 F (36.9 C) (07/23 1142) Pulse Rate:  [81-99] 81 (07/23 1142) Resp:  [12-20] 20 (07/23 1142) BP: (77-139)/(55-88) 111/58 (07/23 1142) SpO2:  [93 %-100 %] 95 % (07/23 1142)   Intake/Output: 07/22 0701 - 07/23 0700 In: 3527.7 [I.V.:3027.7; IV Piggyback:500] Out: 521 [Urine:285; Drains:86; Blood:150] Last BM Date: 04/29/20  Physical Exam: Constitutional: No acute distress Abdomen:  Soft, non-distended, appropriately tender to palpation.  Midline and LLQ incisions are dressed with Prevena vac with good seal.  LLQ blake drain with serosanguinous fluid.  RLQ loop ileostomy patent, pink, viable, with small amount of gas and liquid stool in place.  Red rubber catheter ostomy bridge in place. GU:  Foley in place with dark yellow urine.  Labs:  Recent Labs    04/29/20 0430  WBC 15.2*  HGB 10.5*  HCT 32.9*  PLT 421*   Recent Labs    04/29/20 0430  NA 140  K 3.9  CL 113*  CO2 19*  GLUCOSE 121*  BUN 16  CREATININE 1.28*  CALCIUM 7.9*   No results for input(s): LABPROT, INR in the last 72 hours.  Imaging: No results found.  Assessment/Plan: This is a 79 y.o. female s/p colostomy reversal and diverting loop ileostomy.  --Discussed with the patient that given the small pocket of pus found during the colorectal anastomosis, we decided to perform a diverting loop ileostomy.  Given the purulent fluid, will keep on IV cipro and flagyl for now as a precaution. --Will start clear liquid diet  today. --Keep IV fluids today to hydrate.  Continue foley catheter to get better I&O measurements --OOB, ambulate.  Will order PT consult. --Ostomy consult.   Melvyn Neth, [facility]

## 2020-04-29 NOTE — Evaluation (Signed)
Physical Therapy Evaluation Patient Details Name: Nancy Hickman MRN: [id number] DOB: 12/23/1940 Today's Date: 04/29/2020   History of Present Illness  79 y/o female here for open colostomy reversal with diverting loop ileostomy 7/22  Clinical Impression  Pt eager to work with PT and generally did quite well. She was able to confidently and safety ambulate ~150 ft with walker with mild fatigue and minimal c/o increased abdominal pain.  Her biggest issue was very slow and hesitant transition from supine to sitting EOB, she did not need assist with this but was guarded with log roll and highly reliant on the rail.  She agrees that she will not need HHPT at discharge, but we will maintain on PT caseload to further address mobility and stair training.   Follow Up Recommendations No PT follow up     Equipment Recommendations  None recommended by PT    Recommendations for Other Services       Precautions / Restrictions Precautions Precautions: Fall Restrictions Weight Bearing Restrictions: No      Mobility  Bed Mobility Overal bed mobility: Independent             General bed mobility comments: pt slow to roll to side and attain sitting secondary to abdominal pain but with use of rails did so w/o assist  Transfers Overall transfer level: Independent Equipment used: Rolling walker (2 wheeled)             General transfer comment: Pt able to rise to standing without hesitation (minimal reliance on UEs)  Ambulation/Gait Ambulation/Gait assistance: Supervision Gait Distance (Feet): 150 Feet Assistive device: Rolling walker (2 wheeled)       General Gait Details: Pt did well with ambulation  Stairs            Wheelchair Mobility    Modified Rankin (Stroke Patients Only)       Balance Overall balance assessment: Modified Independent                                           Pertinent Vitals/Pain Pain Assessment: 0-10 Pain Score: 6   Pain Location: abdominal pain    Home Living Family/patient expects to be discharged to:: Private residence Living Arrangements: Alone Available Help at Discharge: Family (daughter lives close grand dtr will stay w/ her initially)   Home Access: Stairs to enter Entrance Stairs-Rails:  (yes) Entrance Stairs-Number of Steps: 4   Home Equipment: Walker - 4 wheels;Cane - single point      Prior Function Level of Independence: Independent         Comments: pt still runs most of errands, etc     Hand Dominance        Extremity/Trunk Assessment   Upper Extremity Assessment Upper Extremity Assessment: Generalized weakness (b/l shoulder elevation limited, R > L)    Lower Extremity Assessment Lower Extremity Assessment: Generalized weakness;Overall WFL for tasks assessed       Communication   Communication: No difficulties  Cognition Arousal/Alertness: Awake/alert Behavior During Therapy: WFL for tasks assessed/performed Overall Cognitive Status: Within Functional Limits for tasks assessed                                        General Comments      Exercises  Assessment/Plan    PT Assessment Patient needs continued PT services  PT Problem List Decreased strength;Decreased activity tolerance;Decreased balance;Decreased mobility;Decreased safety awareness;Decreased range of motion;Decreased knowledge of use of DME       PT Treatment Interventions Gait training;DME instruction;Stair training;Functional mobility training;Therapeutic activities;Therapeutic exercise;Balance training;Neuromuscular re-education;Patient/family education    PT Goals (Current goals can be found in the Care Plan section)  Acute Rehab PT Goals Patient Stated Goal: go home PT Goal Formulation: With patient Time For Goal Achievement: 05/13/20 Potential to Achieve Goals: Fair    Frequency Min 2X/week   Barriers to discharge        Co-evaluation                AM-PAC PT "6 Clicks" Mobility  Outcome Measure Help needed turning from your back to your side while in a flat bed without using bedrails?: A Little Help needed moving from lying on your back to sitting on the side of a flat bed without using bedrails?: A Little Help needed moving to and from a bed to a chair (including a wheelchair)?: None Help needed standing up from a chair using your arms (e.g., wheelchair or bedside chair)?: None Help needed to walk in hospital room?: None Help needed climbing 3-5 steps with a railing? : None 6 Click Score: 22    End of Session Equipment Utilized During Treatment: Gait belt Activity Tolerance: Patient tolerated treatment well Patient left: with chair alarm set;with call bell/phone within reach Nurse Communication: Mobility status PT Visit Diagnosis: Muscle weakness (generalized) (M62.81);Difficulty in walking, not elsewhere classified (R26.2)    Time: 1655-3748 PT Time Calculation (min) (ACUTE ONLY): 31 min   Charges:   PT Evaluation $PT Eval Low Complexity: 1 Low PT Treatments $Gait Training: 8-22 mins        Kreg Shropshire, DPT 04/29/2020, 5:10 PM

## 2020-04-29 NOTE — Progress Notes (Signed)
PT Cancellation Note  Patient Details Name: Nancy Hickman MRN: [id number] DOB: [birth year]   Cancelled Treatment:    Reason Eval/Treat Not Completed: Fatigue/lethargy limiting ability to participate Spoke with nurse ~30 minutes before attempting to see pt this AM and apparently the pt had been asking about when she would be getting up/going for a walk.  On arrival pt asleep, wakes enough to say she needs to sleep more and requests PT to come back this afternoon.  Will attempt to do so as time allows and pt is appropriate.  Kreg Shropshire, DPT 04/29/2020, 11:44 AM

## 2020-04-30 LAB — BASIC METABOLIC PANEL
Anion gap: 8 (ref 5–15)
BUN: 13 mg/dL (ref 8–23)
CO2: 21 mmol/L — ABNORMAL LOW (ref 22–32)
Calcium: 8.2 mg/dL — ABNORMAL LOW (ref 8.9–10.3)
Chloride: 110 mmol/L (ref 98–111)
Creatinine, Ser: 1.1 mg/dL — ABNORMAL HIGH (ref 0.44–1.00)
GFR calc Af Amer: 56 mL/min — ABNORMAL LOW (ref >60)
GFR calc non Af Amer: 48 mL/min — ABNORMAL LOW (ref >60)
Glucose, Bld: 113 mg/dL — ABNORMAL HIGH (ref 70–99)
Potassium: 3.6 mmol/L (ref 3.5–5.1)
Sodium: 139 mmol/L (ref 135–145)

## 2020-04-30 LAB — CBC
HCT: 28.5 % — ABNORMAL LOW (ref 36.0–46.0)
Hemoglobin: 9.2 g/dL — ABNORMAL LOW (ref 12.0–15.0)
MCH: 27.8 pg (ref 26.0–34.0)
MCHC: 32.3 g/dL (ref 30.0–36.0)
MCV: 86.1 fL (ref 80.0–100.0)
Platelets: 345 10*3/uL (ref 150–400)
RBC: 3.31 MIL/uL — ABNORMAL LOW (ref 3.87–5.11)
RDW: 16.4 % — ABNORMAL HIGH (ref 11.5–15.5)
WBC: 13.1 10*3/uL — ABNORMAL HIGH (ref 4.0–10.5)
nRBC: 0 % (ref 0.0–0.2)

## 2020-04-30 LAB — MAGNESIUM: Magnesium: 1.9 mg/dL (ref 1.7–2.4)

## 2020-04-30 NOTE — Progress Notes (Signed)
[place] Hospital Day(s): 2.   Post op day(s): 2 Days Post-Op.   Interval History: Patient seen and examined, no acute events or new complaints overnight. Patient reports feeling okay.  She denies significant pain.  She reports that she has been recovering slowly.  She denies any nausea or vomiting.  There is no pain radiation.  There is no alleviating or aggravating factor.  Vital signs in last 24 hours: [min-max] current  Temp:  [98.4 F (36.9 C)-98.5 F (36.9 C)] 98.4 F (36.9 C) (07/24 0426) Pulse Rate:  [81-88] 84 (07/24 0426) Resp:  [20] 20 (07/24 0426) BP: (107-111)/(58-70) 108/60 (07/24 0426) SpO2:  [95 %-96 %] 96 % (07/24 0426)     Height: 4\' 10"  (147.3 cm) Weight: 86.6 kg BMI (Calculated): 39.91   Physical Exam:  Constitutional: alert, cooperative and no distress  Respiratory: breathing non-labored at rest  Cardiovascular: regular rate and sinus rhythm  Gastrointestinal: soft, non-tender, and non-distended.  Ileostomy pink and patent with enteric content.  Praveena negative pressure dressing in place  Labs:  CBC Latest Ref Rng & Units 04/30/2020 04/29/2020 04/26/2020  WBC 4.0 - 10.5 K/uL 13.1(H) 15.2(H) 7.3  Hemoglobin 12.0 - 15.0 g/dL 9.2(L) 10.5(L) 11.8(L)  Hematocrit 36 - 46 % 28.5(L) 32.9(L) 37.0  Platelets 150 - 400 K/uL 345 421(H) 476(H)   CMP Latest Ref Rng & Units 04/30/2020 04/29/2020 04/26/2020  Glucose 70 - 99 mg/dL 113(H) 121(H) 144(H)  BUN 8 - 23 mg/dL 13 16 16   Creatinine 0.44 - 1.00 mg/dL 1.10(H) 1.28(H) 0.87  Sodium 135 - 145 mmol/L 139 140 140  Potassium 3.5 - 5.1 mmol/L 3.6 3.9 3.9  Chloride 98 - 111 mmol/L 110 113(H) 106  CO2 22 - 32 mmol/L 21(L) 19(L) 25  Calcium 8.9 - 10.3 mg/dL 8.2(L) 7.9(L) 9.2  Total Protein 6.5 - 8.1 g/dL - - 7.9  Total Bilirubin 0.3 - 1.2 mg/dL - - 0.6  Alkaline Phos 38 - 126 U/L - - 108  AST 15 - 41 U/L - - 22  ALT 0 - 44 U/L - - 18    Imaging studies: No new pertinent imaging  studies   Assessment/Plan:  79 y.o. female 2 Days Post-Op s/p colostomy takedown and ileostomy creation due to.  Previous and colostomy in place, complicated by pertinent comorbidities including A. fib, diabetes, history of MI.  Patient recovering very slowly.  She has adequate ileostomy output.  I will advance her diet to full liquids.  I will discontinue the Foley catheter due to improvement in creatinine and adequate urine output.  I encouraged the patient to ambulate.  We will continue with pain management.  We will continue with DVT prophylaxis.  Arnold Long, MD

## 2020-05-01 LAB — BASIC METABOLIC PANEL
Anion gap: 8 (ref 5–15)
BUN: 12 mg/dL (ref 8–23)
CO2: 20 mmol/L — ABNORMAL LOW (ref 22–32)
Calcium: 8.3 mg/dL — ABNORMAL LOW (ref 8.9–10.3)
Chloride: 109 mmol/L (ref 98–111)
Creatinine, Ser: 0.81 mg/dL (ref 0.44–1.00)
GFR calc Af Amer: >60 mL/min (ref >60)
GFR calc non Af Amer: >60 mL/min (ref >60)
Glucose, Bld: 156 mg/dL — ABNORMAL HIGH (ref 70–99)
Potassium: 3.9 mmol/L (ref 3.5–5.1)
Sodium: 137 mmol/L (ref 135–145)

## 2020-05-01 LAB — CBC
HCT: 29.5 % — ABNORMAL LOW (ref 36.0–46.0)
Hemoglobin: 9.5 g/dL — ABNORMAL LOW (ref 12.0–15.0)
MCH: 27.8 pg (ref 26.0–34.0)
MCHC: 32.2 g/dL (ref 30.0–36.0)
MCV: 86.3 fL (ref 80.0–100.0)
Platelets: 383 10*3/uL (ref 150–400)
RBC: 3.42 MIL/uL — ABNORMAL LOW (ref 3.87–5.11)
RDW: 16.8 % — ABNORMAL HIGH (ref 11.5–15.5)
WBC: 12.6 10*3/uL — ABNORMAL HIGH (ref 4.0–10.5)
nRBC: 0 % (ref 0.0–0.2)

## 2020-05-01 LAB — MAGNESIUM: Magnesium: 1.9 mg/dL (ref 1.7–2.4)

## 2020-05-01 MED ORDER — GABAPENTIN 300 MG PO CAPS
300.0000 mg | ORAL_CAPSULE | Freq: Three times a day (TID) | ORAL | Status: DC
  Administered 2020-05-01 – 2020-05-04 (×9): 300 mg via ORAL
  Filled 2020-05-01 (×9): qty 1

## 2020-05-01 MED ORDER — HYDROMORPHONE HCL 1 MG/ML IJ SOLN
0.5000 mg | Freq: Once | INTRAMUSCULAR | Status: AC
  Administered 2020-05-01: 0.5 mg via INTRAVENOUS
  Filled 2020-05-01: qty 0.5

## 2020-05-01 NOTE — Progress Notes (Addendum)
Surgery On-duty Note  I was called by the nurse because the patient started complaining of severe acute abdominal pain.  I came to the bedside.  The patient is complaining of severe abdominal pain on her back down to the rectum.  She reported the pain feels like if a piece of stool or gas want to come out but she cannot get it out.  She denies any anterior abdominal pain.  She denies any problem with the ileostomy.  She denies any nausea or vomiting at this moment.  Vitals:   05/01/20 0318 05/01/20 1153  BP: 118/74 (!) 146/77  Pulse: 78 84  Resp: 16 17  Temp: 98.2 F (36.8 C) 98.3 F (36.8 C)  SpO2: 96% 97%   CBC Latest Ref Rng & Units 05/01/2020 04/30/2020 04/29/2020  WBC 4.0 - 10.5 K/uL 12.6(H) 13.1(H) 15.2(H)  Hemoglobin 12.0 - 15.0 g/dL 9.5(L) 9.2(L) 10.5(L)  Hematocrit 36 - 46 % 29.5(L) 28.5(L) 32.9(L)  Platelets 150 - 400 K/uL 383 345 421(H)   BMP Latest Ref Rng & Units 04/30/2020 04/29/2020 04/26/2020  Glucose 70 - 99 mg/dL 113(H) 121(H) 144(H)  BUN 8 - 23 mg/dL 13 16 16   Creatinine 0.44 - 1.00 mg/dL 1.10(H) 1.28(H) 0.87  Sodium 135 - 145 mmol/L 139 140 140  Potassium 3.5 - 5.1 mmol/L 3.6 3.9 3.9  Chloride 98 - 111 mmol/L 110 113(H) 106  CO2 22 - 32 mmol/L 21(L) 19(L) 25  Calcium 8.9 - 10.3 mg/dL 8.2(L) 7.9(L) 9.2   General: Alert, cooperative, in distress due to pain. Heart: Regular rate and rhythm, no tachycardia Abdomen: Soft and depressible ileostomy pink and patent. Rectal: The rectal vault is soft, there was no palpable masses, stool.  There was no blood or any secretion on my digital rectal exam examination.  The detail a rectal exam did not aggravated the patient's pain.  Assessment and plan:  Patient with acute rectal pain.  Patient has received 0.5 mg of Dilaudid.  I will give an additional dose of 0.5 mg.  As per patient description this looks like a rectal muscle spasm versus trapped gas.  Since patient has not used his rectum for more than a year this pain may be  coming from deconditioning and muscle spasms.  I encouraged the patient to sit down as tolerated or commode to see if that helps her pass gas.  At the moment due to the pain the patient reported that she cannot get out of bed.  I ordered a bladder scan which showed 75 mL.  I ordered an in and out catheter of her bladder.  If there is significant urine output I will keep the Foley in but if the output is minimal I will order to take the catheter out.  I also added gabapentin for the pain control.  Patient currently on tramadol and Dilaudid.  New CBC does not shows any worsening leukocytosis.  White blood cell continue to decrease slowly now down to 12,000 from 13,000.  New BMP is in process.  If any of these intervention with new medication improve her pain and she continue with the severe pain I will consider doing a CT scan of the abdominal pelvis for further evaluation of any intra-abdominal abscess or fluid collection that can be compressing and causing the pain/pressure.  Otherwise patient currently with stable vital signs with normal blood pressures and heart rate.  No fever and no acute peritoneal abdomen to consider any surgical management.  I will follow very closely and  addend this note with any new finding.  This encounter was more than 30 minutes most of the time counseling the patient and coordinating plan of care.   Herbert Pun, MD

## 2020-05-01 NOTE — Progress Notes (Signed)
[facility] Hospital Day(s): 3.   Post op day(s): 3 Days Post-Op.   Interval History: Patient seen and examined, no acute events or new complaints overnight. Patient reports the full liquid did not feel well.  She reports that it makes her feel nauseous but no vomiting.  She has been having adequate ostomy output.  Otherwise no abdominal pain from the surgery.  No alleviating or or aggravating factor.  Vital signs in last 24 hours: [min-max] current  Temp:  [97.9 F (36.6 C)-98.4 F (36.9 C)] 98.2 F (36.8 C) (07/25 0318) Pulse Rate:  [78-86] 78 (07/25 0318) Resp:  [16-18] 16 (07/25 0318) BP: (118-131)/(69-85) 118/74 (07/25 0318) SpO2:  [96 %-97 %] 96 % (07/25 0318)     Height: 4\' 10"  (147.3 cm) Weight: 86.6 kg BMI (Calculated): 39.91   Physical Exam:  Constitutional: alert, cooperative and no distress  Respiratory: breathing non-labored at rest  Cardiovascular: regular rate and sinus rhythm  Gastrointestinal: soft, non-tender, and non-distended.  Ileostomy pink and patent  Labs:  CBC Latest Ref Rng & Units 04/30/2020 04/29/2020 04/26/2020  WBC 4.0 - 10.5 K/uL 13.1(H) 15.2(H) 7.3  Hemoglobin 12.0 - 15.0 g/dL 9.2(L) 10.5(L) 11.8(L)  Hematocrit 36 - 46 % 28.5(L) 32.9(L) 37.0  Platelets 150 - 400 K/uL 345 421(H) 476(H)   CMP Latest Ref Rng & Units 04/30/2020 04/29/2020 04/26/2020  Glucose 70 - 99 mg/dL 113(H) 121(H) 144(H)  BUN 8 - 23 mg/dL 13 16 16   Creatinine 0.44 - 1.00 mg/dL 1.10(H) 1.28(H) 0.87  Sodium 135 - 145 mmol/L 139 140 140  Potassium 3.5 - 5.1 mmol/L 3.6 3.9 3.9  Chloride 98 - 111 mmol/L 110 113(H) 106  CO2 22 - 32 mmol/L 21(L) 19(L) 25  Calcium 8.9 - 10.3 mg/dL 8.2(L) 7.9(L) 9.2  Total Protein 6.5 - 8.1 g/dL - - 7.9  Total Bilirubin 0.3 - 1.2 mg/dL - - 0.6  Alkaline Phos 38 - 126 U/L - - 108  AST 15 - 41 U/L - - 22  ALT 0 - 44 U/L - - 18    Imaging studies: No new pertinent imaging studies   Assessment/Plan:  79 y.o. female 3 Days Post-Op s/p  colostomy takedown and ileostomy creation due to.  Previous and colostomy in place, complicated by pertinent comorbidities including A. fib, diabetes, history of MI.  Patient recovering slowly.  Yesterday had some greets and she feels that she got nauseous a little bit after but no vomiting.  She is willing to try again liquids.  She does not 1 to try a more solid diet yet.  Otherwise she feels that the pain is controlled.  The ileostomy has been working adequately with adequate output.  I will not start any Imodium yet since it is not more than a liter and we did nausea yesterday would not be a good idea to slow down the GI tract..  I encouraged the patient to ambulate.  We will continue with DVT prophylaxis.  We will continue with negative pressure dressing in place.  Ileostomy care as needed.   Arnold Long, MD

## 2020-05-02 LAB — BASIC METABOLIC PANEL
Anion gap: 4 — ABNORMAL LOW (ref 5–15)
BUN: 14 mg/dL (ref 8–23)
CO2: 25 mmol/L (ref 22–32)
Calcium: 7.8 mg/dL — ABNORMAL LOW (ref 8.9–10.3)
Chloride: 112 mmol/L — ABNORMAL HIGH (ref 98–111)
Creatinine, Ser: 0.83 mg/dL (ref 0.44–1.00)
GFR calc Af Amer: >60 mL/min (ref >60)
GFR calc non Af Amer: >60 mL/min (ref >60)
Glucose, Bld: 107 mg/dL — ABNORMAL HIGH (ref 70–99)
Potassium: 4 mmol/L (ref 3.5–5.1)
Sodium: 141 mmol/L (ref 135–145)

## 2020-05-02 LAB — CBC
HCT: 26.2 % — ABNORMAL LOW (ref 36.0–46.0)
Hemoglobin: 8.3 g/dL — ABNORMAL LOW (ref 12.0–15.0)
MCH: 27.6 pg (ref 26.0–34.0)
MCHC: 31.7 g/dL (ref 30.0–36.0)
MCV: 87 fL (ref 80.0–100.0)
Platelets: 372 10*3/uL (ref 150–400)
RBC: 3.01 MIL/uL — ABNORMAL LOW (ref 3.87–5.11)
RDW: 16.7 % — ABNORMAL HIGH (ref 11.5–15.5)
WBC: 11.7 10*3/uL — ABNORMAL HIGH (ref 4.0–10.5)
nRBC: 0.3 % — ABNORMAL HIGH (ref 0.0–0.2)

## 2020-05-02 LAB — MAGNESIUM: Magnesium: 1.9 mg/dL (ref 1.7–2.4)

## 2020-05-02 NOTE — Care Management Important Message (Signed)
Important Message  Patient Details  Name: Nancy Hickman MRN: [id number] Date of Birth: May 26, 1941   Medicare Important Message Given:  Yes     Dannette Barbara 05/02/2020, 11:33 AM

## 2020-05-02 NOTE — Consult Note (Signed)
[facility] Nurse ostomy consult note  [facility] Nurse ostomy follow up Stoma type/location: RLQ ileostomy Stomal assessment/size: 1 and 5/8 inch loop (oval) with functional lumen toward medial. Red rubber catheter bridge in place. Raised and edematous, red, moist Peristomal assessment: Intact, clear. Treatment options for stomal/peristomal skin: skin barrier ring Output: liquid light brown effluent Ostomy pouching: 2pc. 2 and 1/4 inch with skin barrier ring Education provided: Patient is assisted with a review of teaching provided on Friday: difference between ileostomy and colostomy, need to chew food thoroughly to avoid a blockage and ways to avoid dehydration. Written educational materials are in the room. Enrolled patient in [place] Start Discharge program: No.  Patient uses [facility] as her provider. She will require new supplies as her previous products were convex precut 1-inch and the current stoma exceed this and will likely not be this exact size. I will send her home with an adequate number of supplies for 4 weeks  until she can reorder a new size from her supplier. I will provide ordering information.  I communicated with Dr. Hampton Abbot and Surgical PA Z. Delena Bali to let them know I would be happy to remove the red rubber stomal bridge during a subsequent visit prior to discharge if they desire.  Next visit Wednesday, 7/28 unless needed prior.  [facility] nursing team will follow, and will remain available to this patient, the nursing, surgical and medical teams.   Thanks, Maudie Flakes, MSN, RN, [place], [facility]  Pager# [phone]

## 2020-05-02 NOTE — Anesthesia Postprocedure Evaluation (Signed)
Anesthesia Post Note  Patient: Nancy Hickman  Procedure(s) Performed: COLOSTOMY REVERSAL, OPEN (N/A ) HERNIA REPAIR VENTRAL ADULT (N/A ) HERNIA REPAIR PARASTOMAL (N/A ) DIVERTING ILEOSTOMY  Patient location during evaluation: PACU Anesthesia Type: General Level of consciousness: awake and alert Pain management: pain level controlled Vital Signs Assessment: post-procedure vital signs reviewed and stable Respiratory status: spontaneous breathing, nonlabored ventilation and respiratory function stable Cardiovascular status: blood pressure returned to baseline and stable Postop Assessment: no apparent nausea or vomiting Anesthetic complications: no   No complications documented.   Last Vitals:  Vitals:   05/01/20 2002 05/02/20 0410  BP: 103/66 (!) 102/56  Pulse: 81 80  Resp: 16 16  Temp: 36.8 C 36.8 C  SpO2: 97% 96%    Last Pain:  Vitals:   05/02/20 0900  TempSrc:   PainSc: 0-No pain                 Brett Canales R[facility] Be[facility]

## 2020-05-02 NOTE — Progress Notes (Signed)
Physical Therapy Treatment Patient Details Name: Nancy Hickman MRN: [id number] DOB: June 12, 1941 Today's Date: 05/02/2020    History of Present Illness 79 y/o female here for open colostomy reversal with diverting loop ileostomy 7/22    PT Comments    Patient received in bed, reports she had horrible pain yesterday. Improved this morning. Reports dizziness with sitting up in bed. Requiring extra time in sitting prior to attempting standing. She requires min guard for sit to stand transfer and ambulation of 20 feet with RW and min guard. Patient will continue to benefit from skilled PT while here to improve strength and functional independence for return home at discharge.     Follow Up Recommendations  Home health PT     Equipment Recommendations  None recommended by PT    Recommendations for Other Services       Precautions / Restrictions Precautions Precautions: Fall Precaution Comments: drain, wound vac, colostomy Restrictions Weight Bearing Restrictions: No    Mobility  Bed Mobility Overal bed mobility: Modified Independent             General bed mobility comments: requires increased time and use of bed rails  Transfers Overall transfer level: Modified independent Equipment used: Rolling walker (2 wheeled)             General transfer comment: slow with mobility this morning due to reported dizziness upon sitting up. Required increased time and effort.  Ambulation/Gait Ambulation/Gait assistance: Min guard Gait Distance (Feet): 20 Feet Assistive device: Rolling walker (2 wheeled) Gait Pattern/deviations: Step-through pattern Gait velocity: decreased   General Gait Details: slow with mobility and limited by dizziness, stating " I cant do it today."   Stairs             Wheelchair Mobility    Modified Rankin (Stroke Patients Only)       Balance Overall balance assessment: Modified Independent                                           Cognition Arousal/Alertness: Awake/alert Behavior During Therapy: WFL for tasks assessed/performed Overall Cognitive Status: Within Functional Limits for tasks assessed                                        Exercises      General Comments        Pertinent Vitals/Pain Pain Assessment: 0-10 Pain Score: 4  Pain Location: abdominal pain Pain Descriptors / Indicators: Aching;Sore Pain Intervention(s): Monitored during session    Home Living                      Prior Function            PT Goals (current goals can now be found in the care plan section) Acute Rehab PT Goals Patient Stated Goal: go home PT Goal Formulation: With patient Time For Goal Achievement: 05/13/20 Potential to Achieve Goals: Fair Progress towards PT goals: Progressing toward goals (difficulty this visit due to dizziness)    Frequency    Min 2X/week      PT Plan Current plan remains appropriate    Co-evaluation              AM-PAC PT "6 Clicks" Mobility   Outcome Measure  Help  needed turning from your back to your side while in a flat bed without using bedrails?: A Little Help needed moving from lying on your back to sitting on the side of a flat bed without using bedrails?: A Little Help needed moving to and from a bed to a chair (including a wheelchair)?: A Little Help needed standing up from a chair using your arms (e.g., wheelchair or bedside chair)?: A Little Help needed to walk in hospital room?: A Little Help needed climbing 3-5 steps with a railing? : A Little 6 Click Score: 18    End of Session Equipment Utilized During Treatment: Gait belt Activity Tolerance: Other (comment) (limited by dizziness) Patient left: in bed;with call bell/phone within reach;with bed alarm set Nurse Communication: Mobility status PT Visit Diagnosis: Muscle weakness (generalized) (M62.81);Difficulty in walking, not elsewhere classified (R26.2);Dizziness  and giddiness (R42)     Time: 1000-1023 PT Time Calculation (min) (ACUTE ONLY): 23 min  Charges:  $Gait Training: 23-37 mins                     Johnisha Louks, PT, GCS 05/02/20,11:29 AM

## 2020-05-02 NOTE — Progress Notes (Signed)
[facility] Hospital Day(s): 4.   Post op day(s): 4 Days Post-Op.   Interval History:  Patient seen and examined. Late yesterday had severe rectal pain, which is improved this morning  Patient reports she is feeling better, no fever, chills, nausea, or emesis Leukocytosis continues to slowly improve, now 11.7K Renal function is normal, sCr - 0.83, good UO No electrolyte significant derangement appreciated Surgical drain with 45 ccs out, serosanguinous Backed down to NPO yesterday Ileostomy with 325 ccs out in last 24 hours Worked with PT; did well; no recommendations   Vital signs in last 24 hours: [min-max] current  Temp:  [98.3 F (36.8 C)] 98.3 F (36.8 C) (07/26 0410) Pulse Rate:  [80-84] 80 (07/26 0410) Resp:  [16-17] 16 (07/26 0410) BP: (102-146)/(56-77) 102/56 (07/26 0410) SpO2:  [96 %-97 %] 96 % (07/26 0410)     Height: 4\' 10"  (147.3 cm) Weight: 86.6 kg BMI (Calculated): 39.91   Intake/Output last 2 shifts:  07/25 0701 - 07/26 0700 In: 2466 [I.V.:2466] Out: 695 [Urine:325; Drains:45; Stool:325]   Physical Exam:  Constitutional: alert, cooperative and no distress  Respiratory: breathing non-labored at rest  Cardiovascular: regular rate and sinus rhythm  Gastrointestinal: Soft, non-distended, appropriately tender to palpation.  Midline and LLQ incisions are dressed with Prevena vac with good seal.  LLQ blake drain with serosanguinous fluid.  RLQ loop ileostomy patent, pink, viable, with small amount of gas and liquid stool in place.  Red rubber catheter ostomy bridge in place Genitourinary: Foley in palce  Labs:  CBC Latest Ref Rng & Units 05/02/2020 05/01/2020 04/30/2020  WBC 4.0 - 10.5 K/uL 11.7(H) 12.6(H) 13.1(H)  Hemoglobin 12.0 - 15.0 g/dL 8.3(L) 9.5(L) 9.2(L)  Hematocrit 36 - 46 % 26.2(L) 29.5(L) 28.5(L)  Platelets 150 - 400 K/uL 372 383 345   CMP Latest Ref Rng & Units 05/02/2020 05/01/2020 04/30/2020  Glucose 70 - 99  mg/dL 107(H) 156(H) 113(H)  BUN 8 - 23 mg/dL 14 12 13   Creatinine 0.44 - 1.00 mg/dL 0.83 0.81 1.10(H)  Sodium 135 - 145 mmol/L 141 137 139  Potassium 3.5 - 5.1 mmol/L 4.0 3.9 3.6  Chloride 98 - 111 mmol/L 112(H) 109 110  CO2 22 - 32 mmol/L 25 20(L) 21(L)  Calcium 8.9 - 10.3 mg/dL 7.8(L) 8.3(L) 8.2(L)  Total Protein 6.5 - 8.1 g/dL - - -  Total Bilirubin 0.3 - 1.2 mg/dL - - -  Alkaline Phos 38 - 126 U/L - - -  AST 15 - 41 U/L - - -  ALT 0 - 44 U/L - - -     Imaging studies: No new pertinent imaging studies   Assessment/Plan:  79 y.o. female with overall doing well, with improvement in pain from last night 4 Days Post-Op s/p colostomy reversal and diverting loop ileostomy   - Resume full liquid diet  - Discontinue IVF  - Discontinue foley catheter   - Monitor abdominal examination; on-going bowel function  - Pain control prn; antiemetics prn  - Monitor labs prn  - mobilization encouraged   - medical management of comorbid conditions   - DVT prophylaxis   All of the above findings and recommendations were discussed with the patient, and the medical team, and all of patient's questions were answered to her expressed satisfaction.  -- Edison Simon, PA-C [facility] 05/02/2020, 7:39 AM [phone] M-F: 7am - 4pm

## 2020-05-03 NOTE — Progress Notes (Addendum)
[facility] Hospital Day(s): 5.   Post op day(s): 5 Days Post-Op.   Interval History:  Patient seen and examined no acute events or new complaints overnight.  Patient reports she is feeling better. Still with some pressure in the rectum but this is improved No fever, chills, nausea, or emesis No new labs or imaging this morning JP drain slowing, only 5 ccs recorded in last 24 hours, serosanguinous Diverting ileostomy with 425 ccs out in last 24 hours.  Tolerated full liquid diet yesterday Working with PT; now recommending home health   Vital signs in last 24 hours: [min-max] current  Temp:  [98.5 F (36.9 C)-99.8 F (37.7 C)] 98.5 F (36.9 C) (07/27 0434) Pulse Rate:  [90-93] 90 (07/27 0434) Resp:  [16-20] 18 (07/27 0434) BP: (105-144)/(64-79) 144/72 (07/27 0434) SpO2:  [96 %-98 %] 98 % (07/27 0434)     Height: 4\' 10"  (147.3 cm) Weight: 86.6 kg BMI (Calculated): 39.91   Intake/Output last 2 shifts:  07/26 0701 - 07/27 0700 In: 120 [P.O.:120] Out: 429 [Drains:4; Stool:425]   Physical Exam:  Constitutional: alert, cooperative and no distress  Respiratory: breathing non-labored at rest  Cardiovascular: regular rate and sinus rhythm  Gastrointestinal: Soft, non-distended, Non-tender. Midline and LLQ incisions are dressed with Prevena vac with good seal. LLQ blake drain with serosanguinous fluid. RLQ loop ileostomy patent, pink, viable, with gas and liquid and bag. Red rubber catheter ostomy bridge in place  Labs:  CBC Latest Ref Rng & Units 05/02/2020 05/01/2020 04/30/2020  WBC 4.0 - 10.5 K/uL 11.7(H) 12.6(H) 13.1(H)  Hemoglobin 12.0 - 15.0 g/dL 8.3(L) 9.5(L) 9.2(L)  Hematocrit 36 - 46 % 26.2(L) 29.5(L) 28.5(L)  Platelets 150 - 400 K/uL 372 383 345   CMP Latest Ref Rng & Units 05/02/2020 05/01/2020 04/30/2020  Glucose 70 - 99 mg/dL 107(H) 156(H) 113(H)  BUN 8 - 23 mg/dL 14 12 13   Creatinine 0.44 - 1.00 mg/dL 0.83 0.81 1.10(H)  Sodium  135 - 145 mmol/L 141 137 139  Potassium 3.5 - 5.1 mmol/L 4.0 3.9 3.6  Chloride 98 - 111 mmol/L 112(H) 109 110  CO2 22 - 32 mmol/L 25 20(L) 21(L)  Calcium 8.9 - 10.3 mg/dL 7.8(L) 8.3(L) 8.2(L)  Total Protein 6.5 - 8.1 g/dL - - -  Total Bilirubin 0.3 - 1.2 mg/dL - - -  Alkaline Phos 38 - 126 U/L - - -  AST 15 - 41 U/L - - -  ALT 0 - 44 U/L - - -    Imaging studies: No new pertinent imaging studies   Assessment/Plan:  79 y.o. female with persistent bowel function 5 Days Post-Op s/p colostomy reversal and diverting loop ileostomy   - Advance to soft diet   - Monitor abdominal examination; on-going bowel function             - Pain control prn; antiemetics prn             - Monitor labs prn             - mobilization encouraged; working with PT; recommending [place]             - medical management of comorbid conditions              - DVT prophylaxis    - Discharge Planning: possibly home this afternoon vs tomorrow, pending toleration of diet, pain control, and establish HH  All of the above findings and recommendations were discussed with  the patient, and the medical team, and all of patient's questions were answered to her expressed satisfaction.  -- Edison Simon, PA-C [facility] 05/03/2020, 7:38 AM [phone] M-F: 7am - 4pm

## 2020-05-04 ENCOUNTER — Inpatient Hospital Stay: Payer: Medicare Other

## 2020-05-04 ENCOUNTER — Encounter: Payer: Self-pay | Admitting: Surgery

## 2020-05-04 MED ORDER — CYCLOBENZAPRINE HCL 10 MG PO TABS: 5.0000 mg | ORAL_TABLET | Freq: Three times a day (TID) | ORAL | Status: DC | PRN

## 2020-05-04 MED ORDER — GABAPENTIN 300 MG PO CAPS: 300.0000 mg | ORAL_CAPSULE | Freq: Three times a day (TID) | ORAL | 0 refills | Status: DC

## 2020-05-04 MED ORDER — CYCLOBENZAPRINE HCL 5 MG PO TABS: 5.0000 mg | ORAL_TABLET | Freq: Three times a day (TID) | ORAL | 0 refills | Status: DC | PRN

## 2020-05-04 MED ORDER — IOHEXOL 300 MG/ML  SOLN
100.0000 mL | Freq: Once | INTRAMUSCULAR | Status: AC | PRN
  Administered 2020-05-04: 100 mL via INTRAVENOUS

## 2020-05-04 MED ORDER — OXYCODONE HCL 5 MG PO TABS: 5.0000 mg | ORAL_TABLET | ORAL | 0 refills | Status: DC | PRN

## 2020-05-04 MED ORDER — KETOROLAC TROMETHAMINE 15 MG/ML IJ SOLN
15.0000 mg | Freq: Four times a day (QID) | INTRAMUSCULAR | Status: DC
  Administered 2020-05-04: 15 mg via INTRAVENOUS
  Filled 2020-05-04: qty 1

## 2020-05-04 MED ORDER — IBUPROFEN 800 MG PO TABS
800.0000 mg | ORAL_TABLET | Freq: Three times a day (TID) | ORAL | 0 refills | Status: DC | PRN
Start: 2020-05-04 — End: 2021-03-31

## 2020-05-04 MED ORDER — IOHEXOL 9 MG/ML PO SOLN
500.0000 mL | ORAL | Status: AC
  Administered 2020-05-04: 500 mL via ORAL

## 2020-05-04 NOTE — Care Management (Signed)
TOC consult completed.  Home health arranged.  Full note to follow

## 2020-05-04 NOTE — Discharge Summary (Signed)
[facility] SURGICAL DISCHARGE SUMMARY  Patient ID: Nancy Hickman MRN: [id number] DOB/AGE: [birth year] 79 y.o.  Admit date: 04/28/2020 Discharge date: 05/04/2020  Discharge Diagnoses Patient Active Problem List   Diagnosis Date Noted  . Colostomy in place [facility]) 04/28/2020  . Parastomal hernia without obstruction or gangrene   . Incisional hernia, without obstruction or gangrene   . Hiatal hernia with GERD   . Stricture and stenosis of esophagus   . Special screening for malignant neoplasms, colon   . Benign neoplasm of cecum     Consultants None   Procedures 04/28/2020: 1) Open Colostomy takedown with colorectal anastomosis  2) Lysis of adhesions  3) Repair of parastomal hernia  4) Repair of incisional hernia  5) Excision of skin and subcutaneous tissue for 30 cm2 6) Creation of diverting loop ileostomy  7) Wound vac placement < 50 cm   HPI: This is a 79 y.o. femalewith history of prior sigmoidectomy for diverticulitis, complicated by anastomotic leak requiring Hartman's procedure.  She now presents for colostomy reversal.  Hospital Course: Informed consent was obtained and documented, and patient underwent uneventful colostomy takedown with diverting loop ileostomy (Dr Hampton Abbot, 04/28/2020). Post-operatively, patient initially did well. On POD3 she developed the sudden onset of severe rectal pain. She described this as an intense pain and feeling the pressure like she had to have a BM. Her labs, vitals, and examination were reassuring and she continue to have ileostomy function. This pain was resolved the following day. Advancement of patient's diet and ambulation were well-tolerated. The remainder of patient's hospital course was essentially unremarkable, and discharge planning was initiated accordingly with patient safely able to be discharged home with appropriate discharge instructions, pain control, and outpatient follow-up after all of her questions were  answered to her expressed satisfaction.   Discharge Condition: Good    Physical Examination:  Constitutional: alert, cooperative and no distress  Respiratory: breathing non-labored at rest  Cardiovascular: regular rate and sinus rhythm  Gastrointestinal:Soft, non-distended, Non-tender. Midline and LLQ incisions are dressed with Prevena vac with good seal. LLQ blake drain with serosanguinous fluid. RLQ loop ileostomy patent, pink, viable, with gas and liquid and bag. Red rubber catheter ostomy bridge in place   Allergies as of 05/04/2020      Reactions   Doxycycline Rash, Other (See Comments)   "Water blisters"   Lorcet Plus [hydrocodone-acetaminophen] Nausea And Vomiting   Penicillins Nausea And Vomiting, Other (See Comments)   Vomiting to the point of dehydration.   Chlorhexidine    Erythromycin Base Nausea And Vomiting   Neomycin Nausea And Vomiting   Statins Other (See Comments)   Muscle pain.      Medication List    TAKE these medications   aspirin EC 81 MG tablet Take 81 mg by mouth at bedtime. Swallow whole.   CALCIUM PO Take 1 tablet by mouth at bedtime. Chewable   cyclobenzaprine 5 MG tablet Commonly known as: FLEXERIL Take 1 tablet (5 mg total) by mouth 3 (three) times daily as needed for muscle spasms.   diclofenac 75 MG EC tablet Commonly known as: VOLTAREN Take 75 mg by mouth 2 (two) times daily as needed (pain.).   fluticasone 50 MCG/ACT nasal spray Commonly known as: FLONASE Place 1-2 sprays into both nostrils daily as needed for allergies.   gabapentin 300 MG capsule Commonly known as: NEURONTIN Take 1 capsule (300 mg total) by mouth 3 (three) times daily.   ibuprofen 800 MG tablet Commonly known as: ADVIL Take  1 tablet (800 mg total) by mouth every 8 (eight) hours as needed.   meclizine 25 MG tablet Commonly known as: ANTIVERT Take 25 mg by mouth 3 (three) times daily as needed for nausea.   metoprolol succinate 25 MG 24 hr  tablet Commonly known as: TOPROL-XL Take 25 mg by mouth every evening.   omeprazole 20 MG capsule Commonly known as: PRILOSEC Take 20 mg by mouth at bedtime.   ONE TOUCH ULTRA 2 w/Device Kit   OneTouch Delica Plus [id number] Misc   oxyCODONE 5 MG immediate release tablet Commonly known as: Oxy IR/ROXICODONE Take 1-2 tablets (5-10 mg total) by mouth every 4 (four) hours as needed for moderate pain.   traMADol 50 MG tablet Commonly known as: Ultram Take 1 tablet (50 mg total) by mouth every 6 (six) hours as needed.   Vitamin B-12 5000 MCG Tbdp Take 5,000 mcg by mouth at bedtime.   Vitamin D3 50 MCG (2000 UT) Tabs Take 4,000 Units by mouth at bedtime.   Zinc 50 MG Tabs Take 50 mg by mouth at bedtime.         Follow-up Information    Olean Ree, MD. Schedule an appointment as soon as possible for a visit on 05/09/2020.   Specialty: General Surgery Why: s/p colostomy takedown, creation diverting ileostomy, has drain and prevena vac.Marland Kitchen...okay to see Thedore Mins in the morning if needed Contact information: [address] [place] [address] [phone]                Time spent on discharge management including discussion of hospital course, clinical condition, outpatient instructions, prescriptions, and follow up with the patient and members of the medical team: >30 minutes  -- Edison Simon , PA-C [facility]  05/04/2020, 9:51 AM [phone] M-F: 7am - 4pm

## 2020-05-04 NOTE — Plan of Care (Addendum)
Discharge order received. Patient mental status is at baseline. Vital signs stable . No signs of acute distress. Discharge instructions given including JP drain care.  Patient verbalized understanding. No other issues noted at this time.

## 2020-05-05 ENCOUNTER — Telehealth: Payer: Self-pay | Admitting: Surgery

## 2020-05-05 NOTE — Telephone Encounter (Signed)
Spoke with Mardene Celeste with [facility] to provide the OK for home health visit for patients and answered questions about wound care/dressing change. Mardene Celeste verbalized understanding and has no further questions.

## 2020-05-05 NOTE — Telephone Encounter (Signed)
Nancy Hickman w/[facility] returned call from [place], [place].  She is aware that Destiny is currently on lunch & will return her call upon her return.  Nancy Hickman is best reached @ [phone].  Thank you

## 2020-05-05 NOTE — Telephone Encounter (Signed)
Incoming call from [place] at [facility] [phone]).  She needs orders placed for nursing and home health aide.  Please call Mardene Celeste as soon as possible as she will be leaving for vacation after today.  Thank you.

## 2020-05-05 NOTE — Telephone Encounter (Signed)
Left message to give our office a call back.

## 2020-05-08 DIAGNOSIS — I82409 Acute embolism and thrombosis of unspecified deep veins of unspecified lower extremity: Secondary | ICD-10-CM

## 2020-05-08 HISTORY — DX: Acute embolism and thrombosis of unspecified deep veins of unspecified lower extremity: I82.409

## 2020-05-09 ENCOUNTER — Ambulatory Visit (INDEPENDENT_AMBULATORY_CARE_PROVIDER_SITE_OTHER): Payer: Self-pay | Admitting: Surgery

## 2020-05-09 ENCOUNTER — Other Ambulatory Visit: Payer: Self-pay

## 2020-05-09 ENCOUNTER — Encounter: Payer: Self-pay | Admitting: Surgery

## 2020-05-09 VITALS — BP 107/67 | HR 103 | Temp 98.9°F | Resp 16 | Ht <= 58 in | Wt 190.0 lb

## 2020-05-09 DIAGNOSIS — Z09 Encounter for follow-up examination after completed treatment for conditions other than malignant neoplasm: Secondary | ICD-10-CM

## 2020-05-09 DIAGNOSIS — K432 Incisional hernia without obstruction or gangrene: Secondary | ICD-10-CM

## 2020-05-09 DIAGNOSIS — Z933 Colostomy status: Secondary | ICD-10-CM

## 2020-05-09 NOTE — Progress Notes (Signed)
[facility] POST-OP OFFICE VISIT  05/09/2020  HPI: Nancy Hickman is a 79 y.o. female 11 days s/p Open Colostomy takedown with colorectal anastomosis, lysis of adhesions, repair of parastomal hernia, repair of incisional hernia, excision of skin and subcutaneous tissue for 30 cm2, creation of diverting loop ileostomy, wound vac placement < 50 cm with Dr Hampton Abbot  Overall doing well She believes the pain in her rectum can be explained by mucus secretion from the colon. She had one bad day of this post-operatively and this has since slowed Abdominal soreness, managed with gabapentin/ibuprofen/tylenol. She is stopped the flexeril secondary to side effects No nausea, emesis, fever, chills She has had a fairly decent appetite post-operatively JP with <10 ccs daily, serosanguinous Ileostomy with liquid stool, empties it about 4x daily Working with PT at home.   Vital signs: BP 107/67   Pulse (!) 103   Temp 98.9 F (37.2 C) (Oral)   Resp 16   Ht 4\' 10"  (1.473 m)   Wt 190 lb (86.2 kg)   LMP  (LMP Unknown)   SpO2 94%   BMI 39.71 kg/m    Physical Exam: Constitutional: Well appearing female, NAD Abdomen: Soft, non-tender, non-distended, ileostomy in the RLQ which is pink and patent, liquid stool in bag, red rubber in place (I removed this). JP in the LLQ with serosanguinous output (I removed this as well) Skin: Laparotomy and colostomy incision are CDI with staples, no erythema or drainage   Assessment/Plan: This is a 79 y.o. female 11 days s/p Open Colostomy takedown with colorectal anastomosis, lysis of adhesions, repair of parastomal hernia, repair of incisional hernia, excision of skin and subcutaneous tissue for 30 cm2, creation of diverting loop ileostomy, wound vac placement < 50 cm   - We will stop the Flexeril, continue gabapentin, ibuprofen, tylenol  - Discussed adding Imodium 2 mg BID or TID to help control ileostomy output  - Discussed importance of  maintaining hydration and nutritional supplementation   - Removed staples, JP, and red rubber at bedside  - Reviewed wound care/lifting restrictions  - RTC in 2 weeks with Dr Hampton Abbot   -- Edison Simon, PA-C [facility] 05/09/2020, 2:39 PM [phone] M-F: 7am - 4pm

## 2020-05-09 NOTE — Patient Instructions (Signed)
We removed the drain today. Please keep the dressing over the drain site for 2 days. You can stop the Flexeril. You may start with 2 mg of imodium a day and back off if needed.

## 2020-05-12 ENCOUNTER — Telehealth: Payer: Self-pay | Admitting: *Deleted

## 2020-05-12 NOTE — Telephone Encounter (Signed)
Patient called and stated that she had surgery on 04/28/20 Colostomy Reversal and she has been in severe back pain since the surgery but started yesterday she has been passing blood in her urine and stool. She stated that she is very weak feeling and not eating much. Please call and advise

## 2020-05-12 NOTE — Telephone Encounter (Signed)
Patient states there is no blood going into her ostomy bag, she "believes it is coming from her rectum". Pt denies fevers, chills. Denies drainage/redness from incision sites. Message sent to Dr Hampton Abbot and was instructed to either call PCP or go to the ER for further management. Pt does not want to go to PCP and states she will go to the ER instead. Pt advised to call the office if she has any questions or concerns. Dr Hampton Abbot made aware of this.

## 2020-05-13 ENCOUNTER — Other Ambulatory Visit: Payer: Self-pay

## 2020-05-13 ENCOUNTER — Emergency Department: Payer: Medicare Other

## 2020-05-13 ENCOUNTER — Encounter: Payer: Self-pay | Admitting: Emergency Medicine

## 2020-05-13 ENCOUNTER — Inpatient Hospital Stay
Admission: EM | Admit: 2020-05-13 | Discharge: 2020-05-23 | DRG: 394 | Disposition: A | Discharge status: Skilled Nursing Facility | Payer: Medicare Other | Attending: Surgery | Admitting: Surgery

## 2020-05-13 DIAGNOSIS — K219 Gastro-esophageal reflux disease without esophagitis: Secondary | ICD-10-CM | POA: Diagnosis present

## 2020-05-13 DIAGNOSIS — B9620 Unspecified Escherichia coli [E. coli] as the cause of diseases classified elsewhere: Secondary | ICD-10-CM | POA: Diagnosis present

## 2020-05-13 DIAGNOSIS — A498 Other bacterial infections of unspecified site: Secondary | ICD-10-CM | POA: Diagnosis not present

## 2020-05-13 DIAGNOSIS — Z1611 Resistance to penicillins: Secondary | ICD-10-CM | POA: Diagnosis present

## 2020-05-13 DIAGNOSIS — T8141XA Infection following a procedure, superficial incisional surgical site, initial encounter: Secondary | ICD-10-CM | POA: Diagnosis present

## 2020-05-13 DIAGNOSIS — Z9081 Acquired absence of spleen: Secondary | ICD-10-CM

## 2020-05-13 DIAGNOSIS — Z7982 Long term (current) use of aspirin: Secondary | ICD-10-CM

## 2020-05-13 DIAGNOSIS — D638 Anemia in other chronic diseases classified elsewhere: Secondary | ICD-10-CM | POA: Diagnosis present

## 2020-05-13 DIAGNOSIS — L0390 Cellulitis, unspecified: Secondary | ICD-10-CM | POA: Diagnosis present

## 2020-05-13 DIAGNOSIS — I252 Old myocardial infarction: Secondary | ICD-10-CM

## 2020-05-13 DIAGNOSIS — Z888 Allergy status to other drugs, medicaments and biological substances status: Secondary | ICD-10-CM

## 2020-05-13 DIAGNOSIS — E119 Type 2 diabetes mellitus without complications: Secondary | ICD-10-CM | POA: Diagnosis present

## 2020-05-13 DIAGNOSIS — N738 Other specified female pelvic inflammatory diseases: Secondary | ICD-10-CM | POA: Diagnosis present

## 2020-05-13 DIAGNOSIS — K760 Fatty (change of) liver, not elsewhere classified: Secondary | ICD-10-CM | POA: Diagnosis present

## 2020-05-13 DIAGNOSIS — Z20822 Contact with and (suspected) exposure to covid-19: Secondary | ICD-10-CM | POA: Diagnosis present

## 2020-05-13 DIAGNOSIS — T8143XA Infection following a procedure, organ and space surgical site, initial encounter: Secondary | ICD-10-CM

## 2020-05-13 DIAGNOSIS — E669 Obesity, unspecified: Secondary | ICD-10-CM | POA: Diagnosis present

## 2020-05-13 DIAGNOSIS — K9189 Other postprocedural complications and disorders of digestive system: Principal | ICD-10-CM | POA: Diagnosis present

## 2020-05-13 DIAGNOSIS — Z1623 Resistance to quinolones and fluoroquinolones: Secondary | ICD-10-CM | POA: Diagnosis present

## 2020-05-13 DIAGNOSIS — Z881 Allergy status to other antibiotic agents status: Secondary | ICD-10-CM

## 2020-05-13 DIAGNOSIS — K449 Diaphragmatic hernia without obstruction or gangrene: Secondary | ICD-10-CM | POA: Diagnosis present

## 2020-05-13 DIAGNOSIS — N9489 Other specified conditions associated with female genital organs and menstrual cycle: Secondary | ICD-10-CM

## 2020-05-13 DIAGNOSIS — Z932 Ileostomy status: Secondary | ICD-10-CM

## 2020-05-13 DIAGNOSIS — Z853 Personal history of malignant neoplasm of breast: Secondary | ICD-10-CM | POA: Diagnosis not present

## 2020-05-13 DIAGNOSIS — K625 Hemorrhage of anus and rectum: Secondary | ICD-10-CM | POA: Diagnosis present

## 2020-05-13 DIAGNOSIS — D473 Essential (hemorrhagic) thrombocythemia: Secondary | ICD-10-CM | POA: Diagnosis present

## 2020-05-13 DIAGNOSIS — Z79899 Other long term (current) drug therapy: Secondary | ICD-10-CM

## 2020-05-13 DIAGNOSIS — Z9049 Acquired absence of other specified parts of digestive tract: Secondary | ICD-10-CM | POA: Diagnosis not present

## 2020-05-13 DIAGNOSIS — Y832 Surgical operation with anastomosis, bypass or graft as the cause of abnormal reaction of the patient, or of later complication, without mention of misadventure at the time of the procedure: Secondary | ICD-10-CM | POA: Diagnosis present

## 2020-05-13 DIAGNOSIS — D469 Myelodysplastic syndrome, unspecified: Secondary | ICD-10-CM | POA: Diagnosis not present

## 2020-05-13 DIAGNOSIS — Z6839 Body mass index (BMI) 39.0-39.9, adult: Secondary | ICD-10-CM

## 2020-05-13 DIAGNOSIS — R188 Other ascites: Secondary | ICD-10-CM | POA: Diagnosis present

## 2020-05-13 DIAGNOSIS — Z880 Allergy status to penicillin: Secondary | ICD-10-CM

## 2020-05-13 DIAGNOSIS — Z923 Personal history of irradiation: Secondary | ICD-10-CM

## 2020-05-13 LAB — COMPREHENSIVE METABOLIC PANEL
ALT: 19 U/L (ref 0–44)
AST: 29 U/L (ref 15–41)
Albumin: 3.2 g/dL — ABNORMAL LOW (ref 3.5–5.0)
Alkaline Phosphatase: 111 U/L (ref 38–126)
Anion gap: 13 (ref 5–15)
BUN: 11 mg/dL (ref 8–23)
CO2: 22 mmol/L (ref 22–32)
Calcium: 8.7 mg/dL — ABNORMAL LOW (ref 8.9–10.3)
Chloride: 104 mmol/L (ref 98–111)
Creatinine, Ser: 0.81 mg/dL (ref 0.44–1.00)
GFR calc Af Amer: >60 mL/min (ref >60)
GFR calc non Af Amer: >60 mL/min (ref >60)
Glucose, Bld: 144 mg/dL — ABNORMAL HIGH (ref 70–99)
Potassium: 3.3 mmol/L — ABNORMAL LOW (ref 3.5–5.1)
Sodium: 139 mmol/L (ref 135–145)
Total Bilirubin: 0.9 mg/dL (ref 0.3–1.2)
Total Protein: 7.9 g/dL (ref 6.5–8.1)

## 2020-05-13 LAB — LIPASE, BLOOD: Lipase: 25 U/L (ref 11–51)

## 2020-05-13 LAB — CBC
HCT: 27 % — ABNORMAL LOW (ref 36.0–46.0)
Hemoglobin: 8.8 g/dL — ABNORMAL LOW (ref 12.0–15.0)
MCH: 27.2 pg (ref 26.0–34.0)
MCHC: 32.6 g/dL (ref 30.0–36.0)
MCV: 83.3 fL (ref 80.0–100.0)
Platelets: 806 10*3/uL — ABNORMAL HIGH (ref 150–400)
RBC: 3.24 MIL/uL — ABNORMAL LOW (ref 3.87–5.11)
RDW: 16.8 % — ABNORMAL HIGH (ref 11.5–15.5)
WBC: 13 10*3/uL — ABNORMAL HIGH (ref 4.0–10.5)
nRBC: 2 % — ABNORMAL HIGH (ref 0.0–0.2)

## 2020-05-13 LAB — TYPE AND SCREEN
ABO/RH(D): O POS
Antibody Screen: NEGATIVE

## 2020-05-13 MED ORDER — ONDANSETRON 4 MG PO TBDP
4.0000 mg | ORAL_TABLET | Freq: Four times a day (QID) | ORAL | Status: DC | PRN
  Administered 2020-05-14: 4 mg via ORAL
  Filled 2020-05-13 (×2): qty 1

## 2020-05-13 MED ORDER — LACTATED RINGERS IV SOLN: INTRAVENOUS | Status: DC

## 2020-05-13 MED ORDER — ONDANSETRON HCL 4 MG/2ML IJ SOLN
4.0000 mg | Freq: Four times a day (QID) | INTRAMUSCULAR | Status: DC | PRN
  Administered 2020-05-14 – 2020-05-22 (×12): 4 mg via INTRAVENOUS
  Filled 2020-05-13 (×13): qty 2

## 2020-05-13 MED ORDER — CIPROFLOXACIN IN D5W 400 MG/200ML IV SOLN
400.0000 mg | Freq: Two times a day (BID) | INTRAVENOUS | Status: DC
  Administered 2020-05-14 – 2020-05-16 (×4): 400 mg via INTRAVENOUS
  Filled 2020-05-13 (×7): qty 200

## 2020-05-13 MED ORDER — OXYCODONE HCL 5 MG PO TABS
5.0000 mg | ORAL_TABLET | ORAL | Status: DC | PRN
  Administered 2020-05-13: 5 mg via ORAL
  Administered 2020-05-19 – 2020-05-23 (×3): 10 mg via ORAL
  Filled 2020-05-13 (×2): qty 2
  Filled 2020-05-13 (×2): qty 1
  Filled 2020-05-13 (×2): qty 2

## 2020-05-13 MED ORDER — PANTOPRAZOLE SODIUM 40 MG IV SOLR
40.0000 mg | Freq: Every day | INTRAVENOUS | Status: DC
  Administered 2020-05-13 – 2020-05-19 (×7): 40 mg via INTRAVENOUS
  Filled 2020-05-13 (×7): qty 40

## 2020-05-13 MED ORDER — METRONIDAZOLE IN NACL 5-0.79 MG/ML-% IV SOLN
500.0000 mg | Freq: Three times a day (TID) | INTRAVENOUS | Status: DC
  Administered 2020-05-14 – 2020-05-16 (×8): 500 mg via INTRAVENOUS
  Filled 2020-05-13 (×10): qty 100

## 2020-05-13 MED ORDER — PSYLLIUM 95 % PO PACK
1.0000 | PACK | Freq: Every day | ORAL | Status: DC
  Filled 2020-05-13 (×11): qty 1

## 2020-05-13 MED ORDER — FLUTICASONE PROPIONATE 50 MCG/ACT NA SUSP
1.0000 | Freq: Every day | NASAL | Status: DC | PRN
  Filled 2020-05-13: qty 16

## 2020-05-13 MED ORDER — IOHEXOL 300 MG/ML  SOLN
100.0000 mL | Freq: Once | INTRAMUSCULAR | Status: AC | PRN
  Administered 2020-05-13: 100 mL via INTRAVENOUS

## 2020-05-13 MED ORDER — ONDANSETRON HCL 4 MG/2ML IJ SOLN
4.0000 mg | Freq: Once | INTRAMUSCULAR | Status: AC
  Administered 2020-05-13: 4 mg via INTRAVENOUS
  Filled 2020-05-13: qty 2

## 2020-05-13 MED ORDER — KETOROLAC TROMETHAMINE 15 MG/ML IJ SOLN
15.0000 mg | Freq: Four times a day (QID) | INTRAMUSCULAR | Status: AC | PRN
  Administered 2020-05-14 – 2020-05-18 (×5): 15 mg via INTRAVENOUS
  Filled 2020-05-13 (×7): qty 1

## 2020-05-13 MED ORDER — MORPHINE SULFATE (PF) 4 MG/ML IV SOLN
4.0000 mg | INTRAVENOUS | Status: DC | PRN
  Administered 2020-05-13: 4 mg via INTRAVENOUS
  Filled 2020-05-13: qty 1

## 2020-05-13 MED ORDER — HYDROMORPHONE HCL 1 MG/ML IJ SOLN
0.5000 mg | INTRAMUSCULAR | Status: DC | PRN
  Administered 2020-05-16 – 2020-05-18 (×3): 0.5 mg via INTRAVENOUS
  Filled 2020-05-13 (×3): qty 1

## 2020-05-13 MED ORDER — ACETAMINOPHEN 500 MG PO TABS
1000.0000 mg | ORAL_TABLET | Freq: Four times a day (QID) | ORAL | Status: DC | PRN
  Administered 2020-05-14 – 2020-05-21 (×3): 1000 mg via ORAL
  Filled 2020-05-13 (×3): qty 2

## 2020-05-13 MED ORDER — SODIUM CHLORIDE 0.9 % IV BOLUS
500.0000 mL | Freq: Once | INTRAVENOUS | Status: AC
  Administered 2020-05-13: 500 mL via INTRAVENOUS

## 2020-05-13 MED ORDER — METOPROLOL SUCCINATE ER 25 MG PO TB24
25.0000 mg | ORAL_TABLET | Freq: Every evening | ORAL | Status: DC
  Administered 2020-05-14 – 2020-05-22 (×8): 25 mg via ORAL
  Filled 2020-05-13 (×8): qty 1

## 2020-05-13 MED ORDER — IOHEXOL 9 MG/ML PO SOLN
1000.0000 mL | Freq: Once | ORAL | Status: AC | PRN
  Administered 2020-05-13: 1000 mL via ORAL

## 2020-05-13 NOTE — ED Notes (Signed)
Pt provided with sandwich tray per request 
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
## 2020-05-13 NOTE — ED Notes (Signed)
Lab called for specimen collection 

## 2020-05-13 NOTE — ED Triage Notes (Signed)
First RN Note: pt presents to ED via [facility] from home with c/o rectal bleeding. Per EMS pt had ileostomy palced approx 2 weeks ago. Per EMS pt began with bright red rectal bleeding x 2 days, denies issues with ileostomy site.   159/74 101HR 96%  CBG 143

## 2020-05-13 NOTE — ED Triage Notes (Signed)
Pt reports has intermittent abdominal pain then passes mucous and blood. Pt presents alert and oriented at this time.
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
## 2020-05-14 ENCOUNTER — Inpatient Hospital Stay: Payer: Self-pay

## 2020-05-14 ENCOUNTER — Inpatient Hospital Stay: Payer: Medicare Other

## 2020-05-14 LAB — BASIC METABOLIC PANEL
Anion gap: 11 (ref 5–15)
BUN: 8 mg/dL (ref 8–23)
CO2: 21 mmol/L — ABNORMAL LOW (ref 22–32)
Calcium: 8.1 mg/dL — ABNORMAL LOW (ref 8.9–10.3)
Chloride: 103 mmol/L (ref 98–111)
Creatinine, Ser: 0.92 mg/dL (ref 0.44–1.00)
GFR calc Af Amer: >60 mL/min (ref >60)
GFR calc non Af Amer: 60 mL/min — ABNORMAL LOW (ref >60)
Glucose, Bld: 124 mg/dL — ABNORMAL HIGH (ref 70–99)
Potassium: 3.4 mmol/L — ABNORMAL LOW (ref 3.5–5.1)
Sodium: 135 mmol/L (ref 135–145)

## 2020-05-14 LAB — CBC WITH DIFFERENTIAL/PLATELET
Abs Immature Granulocytes: 0.24 10*3/uL — ABNORMAL HIGH (ref 0.00–0.07)
Basophils Absolute: 0.1 10*3/uL (ref 0.0–0.1)
Basophils Relative: 1 %
Eosinophils Absolute: 0.4 10*3/uL (ref 0.0–0.5)
Eosinophils Relative: 3 %
HCT: 22.8 % — ABNORMAL LOW (ref 36.0–46.0)
Hemoglobin: 7.4 g/dL — ABNORMAL LOW (ref 12.0–15.0)
Immature Granulocytes: 2 %
Lymphocytes Relative: 16 %
Lymphs Abs: 2.1 10*3/uL (ref 0.7–4.0)
MCH: 26.6 pg (ref 26.0–34.0)
MCHC: 32.5 g/dL (ref 30.0–36.0)
MCV: 82 fL (ref 80.0–100.0)
Monocytes Absolute: 1.7 10*3/uL — ABNORMAL HIGH (ref 0.1–1.0)
Monocytes Relative: 13 %
Neutro Abs: 8.7 10*3/uL — ABNORMAL HIGH (ref 1.7–7.7)
Neutrophils Relative %: 65 %
Platelets: 702 10*3/uL — ABNORMAL HIGH (ref 150–400)
RBC: 2.78 MIL/uL — ABNORMAL LOW (ref 3.87–5.11)
RDW: 16.5 % — ABNORMAL HIGH (ref 11.5–15.5)
WBC: 13.3 10*3/uL — ABNORMAL HIGH (ref 4.0–10.5)
nRBC: 2 % — ABNORMAL HIGH (ref 0.0–0.2)

## 2020-05-14 LAB — PROTIME-INR
INR: 1.1 (ref 0.8–1.2)
Prothrombin Time: 13.7 seconds (ref 11.4–15.2)

## 2020-05-14 LAB — MAGNESIUM: Magnesium: 2.1 mg/dL (ref 1.7–2.4)

## 2020-05-14 MED ORDER — DIPHENHYDRAMINE HCL 50 MG/ML IJ SOLN
12.5000 mg | Freq: Four times a day (QID) | INTRAMUSCULAR | Status: DC | PRN
  Filled 2020-05-14: qty 1

## 2020-05-14 MED ORDER — FENTANYL CITRATE (PF) 100 MCG/2ML IJ SOLN
INTRAMUSCULAR | Status: AC | PRN
  Administered 2020-05-14 (×2): 25 ug via INTRAVENOUS

## 2020-05-14 MED ORDER — DIPHENHYDRAMINE HCL 25 MG PO TABS
12.5000 mg | ORAL_TABLET | Freq: Four times a day (QID) | ORAL | Status: DC | PRN
  Filled 2020-05-14 (×3): qty 0.5

## 2020-05-14 MED ORDER — FENTANYL CITRATE (PF) 100 MCG/2ML IJ SOLN
INTRAMUSCULAR | Status: AC
  Filled 2020-05-14: qty 2

## 2020-05-14 MED ORDER — POTASSIUM CHLORIDE 10 MEQ/100ML IV SOLN
10.0000 meq | INTRAVENOUS | Status: AC
  Administered 2020-05-14: 10 meq via INTRAVENOUS
  Filled 2020-05-14: qty 100

## 2020-05-14 MED ORDER — MIDAZOLAM HCL 2 MG/2ML IJ SOLN
INTRAMUSCULAR | Status: AC
  Filled 2020-05-14: qty 4

## 2020-05-14 MED ORDER — MIDAZOLAM HCL 2 MG/2ML IJ SOLN
INTRAMUSCULAR | Status: AC | PRN
  Administered 2020-05-14 (×2): 1 mg via INTRAVENOUS

## 2020-05-14 MED ORDER — POTASSIUM CHLORIDE 10 MEQ/100ML IV SOLN
10.0000 meq | INTRAVENOUS | Status: AC
  Administered 2020-05-14 – 2020-05-15 (×3): 10 meq via INTRAVENOUS
  Filled 2020-05-14 (×3): qty 100

## 2020-05-14 NOTE — Progress Notes (Signed)
05/14/2020  Subjective: Patient started having low grade temp this morning of 100.1.  Reports she's still having bloody mucus drainage per rectum, still having lower abdominal pain.  WBC just slightly elevated today to 13.3 from 13.0 yesterday.  Hemoglobin a point lower to 7.4 from 8.8 yesterday.  Vital signs: Temp:  [99.2 F (37.3 C)-100.1 F (37.8 C)] 100.1 F (37.8 C) (08/07 0807) Pulse Rate:  [99-106] 99 (08/07 0807) Resp:  [16-18] 18 (08/07 0807) BP: (94-147)/(40-67) 112/63 (08/07 0807) SpO2:  [92 %-97 %] 96 % (08/07 0807)   Intake/Output: 08/06 0701 - 08/07 0700 In: 482.3 [I.V.:182.3; IV Piggyback:300] Out: 400 [Stool:400] Last BM Date: 05/14/20  Physical Exam: Constitutional: No acute distress Abdomen:  Soft, non-distended, with erythema at the mid portion of the midline incision, with now somewhat purulent fluid drainage.  Few steri strips removed, revealing a 1 cm opening.  This was probed with qtip, revealing a cavity about 4 cm in size.  This was packed with 4x4 gauze.  Loop ileostomy healthy, patent, with loose stool in bag.  Labs:  Recent Labs    05/13/20 1007 05/14/20 0442  WBC 13.0* 13.3*  HGB 8.8* 7.4*  HCT 27.0* 22.8*  PLT 806* 702*   Recent Labs    05/13/20 1007 05/14/20 0442  NA 139 135  K 3.3* 3.4*  CL 104 103  CO2 22 21*  GLUCOSE 144* 124*  BUN 11 8  CREATININE 0.81 0.92  CALCIUM 8.7* 8.1*   Recent Labs    05/14/20 0442  LABPROT 13.7  INR 1.1    Imaging: CT ABDOMEN PELVIS W CONTRAST  Result Date: 05/13/2020 CLINICAL DATA:  Abdominal pain and bloody stools, initial encounter EXAM: CT ABDOMEN AND PELVIS WITH CONTRAST TECHNIQUE: Multidetector CT imaging of the abdomen and pelvis was performed using the standard protocol following bolus administration of intravenous contrast. CONTRAST:  152mL OMNIPAQUE IOHEXOL 300 MG/ML  SOLN COMPARISON:  05/04/2020 FINDINGS: Lower chest: Mild atelectatic changes are noted in the left lung base. Large hiatal  hernia is again seen. Hepatobiliary: Fatty infiltration of the liver is noted. The gallbladder has been surgically removed. Pancreas: Unremarkable. No pancreatic ductal dilatation or surrounding inflammatory changes. Spleen: Spleen has been surgically removed. Adrenals/Urinary Tract: Adrenal glands are unremarkable. Kidneys demonstrate a normal enhancement pattern bilaterally. Hypodensities are noted within the kidneys bilaterally consistent with cysts stable from the prior exam. The bladder is partially distended. Stomach/Bowel: Fluid-filled rectum is noted with changes consistent with Hartmann's pouch. Loop ostomy is again noted in the right mid abdomen. Changes of prior colostomy on the left are seen which has been taken down. Previously seen surgical drain has been removed. Small bowel is within normal limits without obstructive change. Resolving inflammatory change in the mesentery is noted related to the prior surgery. Vascular/Lymphatic: Aortic atherosclerosis. No enlarged abdominal or pelvic lymph nodes. Reproductive: Calcified uterine fibroids are noted. Additionally there are 2 fluid attenuation areas within the midportion of the uterus likely within the endometrial canal. These are of uncertain etiology but likely related to focal hemorrhage/seroma possibly related to the recent surgery. They have arisen in the interval from the prior exam. No adnexal abnormality is noted. Other: There remains a mixed attenuation area posterior to the uterus similar to that seen on the prior exam but now measuring approximately 4.7 x 4.1 cm in greatest transverse and AP dimensions respectively. This extends for approximately 7.2 cm in craniocaudad projection. This has increased in size when compared with the prior exam and is  again consistent with postoperative hematoma. A second similar area is noted in the right lower quadrant measuring 5.9 x 3.3 cm also increased in size consistent with postoperative hematoma. No  definitive abscess is seen. Musculoskeletal: Degenerative changes of lumbar spine are seen. IMPRESSION: Increasing areas of mixed attenuation consistent with postoperative hematomas. No definitive abscess is noted at this time. Changes consistent with previous surgery and right sided loop ileostomy. No obstructive changes are seen. New areas of fluid attenuation within the endometrial canal of uncertain significance. These have arisen in the interval from the prior exam and may represent small hematomas. Clinical correlation with the physical exam is recommended. These may be related to small hematomas. Electronically Signed   By: Inez Catalina M.D.   On: 05/13/2020 16:56    Assessment/Plan: This is a 79 y.o. female open colostomy reversal with diverting loop ileostomy due to intraoperative leak from the colorectal anastomosis  -Discussed with Dr. Anselm Pancoast with radiology yesterday and she will go to IR today for attempt to drain the pelvic fluid collection that appears to have rim enhancement on coronal images on CT scan. -The patient's midline incision had a 1 cm opening which was extended a little bit to 1.5 cm to allow for gauze to pack into the wound.  We will continue doing this dressing change once daily. -Continue IV antibiotics. -We will be able to resume diet once procedure is done this morning.   Melvyn Neth, [facility]

## 2020-05-14 NOTE — Progress Notes (Signed)
Dear Doctor:  This patient has been identified as a candidate for PICC for the following reason (s): poor veins/poor circulatory system (CHF, COPD, emphysema, diabetes, steroid use, IV drug abuse, etc.) and restarts due to phlebitis and infiltration in 24 hours If you agree, please write an order for the indicated device. For any questions contact the Vascular Access Team at [phone] if no answer, please leave a message.  Thank you for supporting the early vascular access assessment program. 
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
## 2020-05-14 NOTE — Procedures (Signed)
Interventional Radiology Procedure:   Indications: Post operative hematoma and elevated WBC  Procedure: CT guided pelvic aspiration  Findings: NEW contrast in vagina.   Concern for fistula between bowel and uterus.   Aspirated pelvic hematoma and removed 3 ml of dark old blood.  Sent fluid for culture.   Complications: No immediate     EBL: less than 10 ml  Plan: Sent fluid for culture.     Nancy Kister R. Anselm Pancoast, MD  Pager: [phone]
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
## 2020-05-14 NOTE — Progress Notes (Signed)
Called to notify PICC will not be placed today.  Will call 05/15/20 re placement.  Per IV RN, pt has PIV for needs at this time.

## 2020-05-14 NOTE — Progress Notes (Signed)
Pt in bed. Pt had bloody discharge come out of rectum. RN cleaned pt up. RN gave scheduled meds, IV infiltrated during IV infusion. RN removed old site and got a new IV site in [facility]. Per pt this IV site is very painful. RN to put in IV team consult so Pt can finish IV abx. Call bell within reach.

## 2020-05-15 ENCOUNTER — Inpatient Hospital Stay: Payer: Medicare Other

## 2020-05-15 DIAGNOSIS — N9489 Other specified conditions associated with female genital organs and menstrual cycle: Secondary | ICD-10-CM

## 2020-05-15 LAB — BASIC METABOLIC PANEL
Anion gap: 9 (ref 5–15)
BUN: 7 mg/dL — ABNORMAL LOW (ref 8–23)
CO2: 22 mmol/L (ref 22–32)
Calcium: 8.1 mg/dL — ABNORMAL LOW (ref 8.9–10.3)
Chloride: 103 mmol/L (ref 98–111)
Creatinine, Ser: 0.79 mg/dL (ref 0.44–1.00)
GFR calc Af Amer: >60 mL/min (ref >60)
GFR calc non Af Amer: >60 mL/min (ref >60)
Glucose, Bld: 108 mg/dL — ABNORMAL HIGH (ref 70–99)
Potassium: 3.6 mmol/L (ref 3.5–5.1)
Sodium: 134 mmol/L — ABNORMAL LOW (ref 135–145)

## 2020-05-15 LAB — CBC
HCT: 23.2 % — ABNORMAL LOW (ref 36.0–46.0)
Hemoglobin: 7.7 g/dL — ABNORMAL LOW (ref 12.0–15.0)
MCH: 27 pg (ref 26.0–34.0)
MCHC: 33.2 g/dL (ref 30.0–36.0)
MCV: 81.4 fL (ref 80.0–100.0)
Platelets: 689 10*3/uL — ABNORMAL HIGH (ref 150–400)
RBC: 2.85 MIL/uL — ABNORMAL LOW (ref 3.87–5.11)
RDW: 16.5 % — ABNORMAL HIGH (ref 11.5–15.5)
WBC: 12.6 10*3/uL — ABNORMAL HIGH (ref 4.0–10.5)
nRBC: 1.5 % — ABNORMAL HIGH (ref 0.0–0.2)

## 2020-05-15 MED ORDER — DIPHENHYDRAMINE HCL 50 MG/ML IJ SOLN
12.5000 mg | Freq: Four times a day (QID) | INTRAMUSCULAR | Status: AC | PRN
  Administered 2020-05-15 – 2020-05-17 (×4): 12.5 mg via INTRAVENOUS
  Filled 2020-05-15 (×4): qty 1

## 2020-05-15 NOTE — Plan of Care (Signed)
Pt compliant with POC. Pt experienced mild itching in hand while receiving cipro, however, no redness or swelling noted. Problem: Education: Goal: Knowledge of General Education information will improve Description: Including pain rating scale, medication(s)/side effects and non-pharmacologic comfort measures Outcome: Progressing   Problem: Health Behavior/Discharge Planning: Goal: Ability to manage health-related needs will improve Outcome: Progressing   Problem: Clinical Measurements: Goal: Ability to maintain clinical measurements within normal limits will improve Outcome: Progressing Goal: Will remain free from infection Outcome: Progressing Goal: Diagnostic test results will improve Outcome: Progressing Goal: Respiratory complications will improve Outcome: Progressing Goal: Cardiovascular complication will be avoided Outcome: Progressing   Problem: Activity: Goal: Risk for activity intolerance will decrease Outcome: Progressing   Problem: Nutrition: Goal: Adequate nutrition will be maintained Outcome: Progressing   Problem: Coping: Goal: Level of anxiety will decrease Outcome: Progressing   Problem: Elimination: Goal: Will not experience complications related to bowel motility Outcome: Progressing Goal: Will not experience complications related to urinary retention Outcome: Progressing   Problem: Pain Managment: Goal: General experience of comfort will improve Outcome: Progressing   Problem: Safety: Goal: Ability to remain free from injury will improve Outcome: Progressing   Problem: Skin Integrity: Goal: Risk for impaired skin integrity will decrease Outcome: Progressing

## 2020-05-15 NOTE — Progress Notes (Signed)
PT Cancellation Note  Patient Details Name: Nancy Hickman MRN: [id number] DOB: Feb 18, 1941   Cancelled Treatment:    Reason Eval/Treat Not Completed: Fatigue/lethargy limiting ability to participate. Patient is having nausea.   [address], [place], [place] DPT 05/15/2020, 2:13 PM

## 2020-05-15 NOTE — Progress Notes (Signed)
At bedside to place PICC.  Pt cooperative and pleasant.  As beginning to assess arm, pt became very nauseated, heaving into emesis bag, nonproductive.  Pt states she does not feel well enough to place PICC at this time. Requests to wait until tomorrow.  Pt has 2 PIV's working well at this time, only one site needed for current meds ordered. Jun RN notified.  Securechat sent to MD to notify of the above.

## 2020-05-15 NOTE — Progress Notes (Signed)
05/15/2020  Subjective: No acute events overnight.  Patient reports feeling better today compared to yesterday.  WBC mildly improved.  Reports still having mucus per rectum, with some blood tinge but not frank blood or clots.  IR perc drain was attempted yesterday but was mostly a hematoma.  Fluid was aspirated and sent for cultures.  There was concern during CT guided aspiration that there was contrast in her vagina as well as possible fluid collections in the uterus wall.  Patient was evaluated by Dr. Ouida Sills yesterday and there was no evidence of fistula to the vaginal canal.  However could not evaluate the uterus.  Vital signs: Temp:  [98.3 F (36.8 C)-99.6 F (37.6 C)] 99.6 F (37.6 C) (08/08 0721) Pulse Rate:  [68-97] 90 (08/08 0721) Resp:  [9-22] 15 (08/08 0721) BP: (86-127)/(45-84) 105/54 (08/08 0721) SpO2:  [93 %-100 %] 97 % (08/08 0721) FiO2 (%):  [34 %] 34 % (08/07 1230)   Intake/Output: 08/07 0701 - 08/08 0700 In: 100 [P.O.:100] Out: 1450 [Urine:950; Stool:500] Last BM Date: 05/14/20  Physical Exam: Constitutional: No acute distress Abdomen:  Soft, non-distended, non-tender.  Midline incision has 1.5 cm opening at the level of the umbilicus, with some minimal purulent drainage.  Wound cleaned and re-packed with gauze dressing.  Ostomy pink, patent, with liquid stool in bag.  Labs:  Recent Labs    05/14/20 0442 05/15/20 0418  WBC 13.3* 12.6*  HGB 7.4* 7.7*  HCT 22.8* 23.2*  PLT 702* 689*   Recent Labs    05/14/20 0442 05/15/20 0418  NA 135 134*  K 3.4* 3.6  CL 103 103  CO2 21* 22  GLUCOSE 124* 108*  BUN 8 7*  CREATININE 0.92 0.79  CALCIUM 8.1* 8.1*   Recent Labs    05/14/20 0442  LABPROT 13.7  INR 1.1    Imaging: CT IMAGE GUIDED DRAINAGE BY PERCUTANEOUS CATHETER  Result Date: 05/14/2020 INDICATION: 79 year old with a complex surgical history including recent colostomy takedown and creation of diverting ileostomy. Patient has a slightly elevated  white blood cell count and concern for bloody mucus per rectum. Abdominal imaging demonstrated mixed attenuation collections in the abdomen and pelvis suggestive for hematomas. Request for CT-guided drainage of the pelvic collection. EXAM: CT-GUIDED ASPIRATION OF PELVIC HEMATOMA MEDICATIONS: The patient is currently admitted to the hospital and receiving intravenous antibiotics. ANESTHESIA/SEDATION: 2.0 mg IV Versed 50 mcg IV Fentanyl Moderate Sedation Time:  15 minutes The patient was continuously monitored during the procedure by the interventional radiology nurse under my direct supervision. COMPLICATIONS: None immediate. TECHNIQUE: Informed written consent was obtained from the patient after a thorough discussion of the procedural risks, benefits and alternatives. All questions were addressed. A timeout was performed prior to the initiation of the procedure. PROCEDURE: Patient was placed on her right side. CT images through the pelvis were obtained. The hyperdense pelvic fluid collection was identified and targeted. The left gluteal region was prepped with chlorhexidine and sterile field was created. Skin and soft tissues were anesthetized with 1% lidocaine. Small skin incision was made. Using CT guidance, an 18 gauge trocar needle was directed into the pelvic fluid collection from a left transgluteal approach. Approximately 3 mL of dark bloody fluid was aspirated. No additional fluid could be aspirated. Fluid was sent for culture. Bandage placed over the puncture site. FINDINGS: CT images demonstrated a small amount of contrast in the vagina. This is new compared to the exam on 05/13/2020. There is no evidence of contrast in the rectum  or the other visualized bowel loops. Pelvic fluid collection is hyperdense and compatible with a pelvic hematoma. IMPRESSION: 1. CT-guided aspiration of the pelvic hematoma. Small amount of dark bloody fluid was aspirated. Fluid was sent for culture. 2. Evidence for oral contrast  in the vagina which is new from the exam on 05/13/2020. 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 Findings discussed with Dr. Hampton Abbot. Electronically Signed   By: Markus Daft M.D.   On: 05/14/2020 14:23   [place] EKG SITE RITE  Result Date: 05/14/2020 If Site Rite image not attached, placement could not be confirmed due to current cardiac rhythm.   Assessment/Plan: This is a 79 y.o. 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  --Will order pelvic and transvaginal ultrasound today to evaluate her hematoma and uterine wall better. --Discussed with her that tomorrow we would order a water soluble contrast enema study tomorrow to evaluate the anastomosis for any leak or possible fistula.   --continue dressing changes to midline incision BID --continue IV antibiotics for midilne wound infection as well as suspected hematoma/abscess. --Will have PICC line placed today -- her PIVs have been infiltrating. --regular diet --OOB, ambulate, PT consult for deconditioning.   Melvyn Neth, [facility]

## 2020-05-16 ENCOUNTER — Inpatient Hospital Stay: Payer: Medicare Other

## 2020-05-16 ENCOUNTER — Encounter: Payer: Self-pay | Admitting: Surgery

## 2020-05-16 LAB — BASIC METABOLIC PANEL
Anion gap: 8 (ref 5–15)
BUN: 8 mg/dL (ref 8–23)
CO2: 23 mmol/L (ref 22–32)
Calcium: 8.1 mg/dL — ABNORMAL LOW (ref 8.9–10.3)
Chloride: 106 mmol/L (ref 98–111)
Creatinine, Ser: 0.79 mg/dL (ref 0.44–1.00)
GFR calc Af Amer: >60 mL/min (ref >60)
GFR calc non Af Amer: >60 mL/min (ref >60)
Glucose, Bld: 128 mg/dL — ABNORMAL HIGH (ref 70–99)
Potassium: 3.7 mmol/L (ref 3.5–5.1)
Sodium: 137 mmol/L (ref 135–145)

## 2020-05-16 LAB — CBC
HCT: 23.7 % — ABNORMAL LOW (ref 36.0–46.0)
Hemoglobin: 7.9 g/dL — ABNORMAL LOW (ref 12.0–15.0)
MCH: 27.2 pg (ref 26.0–34.0)
MCHC: 33.3 g/dL (ref 30.0–36.0)
MCV: 81.7 fL (ref 80.0–100.0)
Platelets: 685 10*3/uL — ABNORMAL HIGH (ref 150–400)
RBC: 2.9 MIL/uL — ABNORMAL LOW (ref 3.87–5.11)
RDW: 16.5 % — ABNORMAL HIGH (ref 11.5–15.5)
WBC: 13.8 10*3/uL — ABNORMAL HIGH (ref 4.0–10.5)
nRBC: 1.2 % — ABNORMAL HIGH (ref 0.0–0.2)

## 2020-05-16 MED ORDER — IOHEXOL 9 MG/ML PO SOLN
500.0000 mL | ORAL | Status: AC | PRN
  Administered 2020-05-16: 500 mL via ORAL

## 2020-05-16 MED ORDER — IOHEXOL 300 MG/ML  SOLN
100.0000 mL | Freq: Once | INTRAMUSCULAR | Status: AC | PRN
  Administered 2020-05-16: 100 mL via INTRAVENOUS

## 2020-05-16 MED ORDER — PIPERACILLIN-TAZOBACTAM 3.375 G IVPB
3.3750 g | Freq: Three times a day (TID) | INTRAVENOUS | Status: DC
  Administered 2020-05-16 – 2020-05-23 (×22): 3.375 g via INTRAVENOUS
  Filled 2020-05-16 (×21): qty 50

## 2020-05-16 NOTE — Progress Notes (Signed)
Pt went to CT

## 2020-05-16 NOTE — Progress Notes (Signed)
[place] [facility] Day(s): 3.   Interval History:  Patient seen and examined no acute events or new complaints overnight.  Patient reports she is sleepy, and just received pain medications for some abdominal discomfort No nausea or emesis Cx from aspiration over the weekend did grow out E coli, susceptibilities reviewed; will switch ABx to zosyn Stably elevated leukocytosis to 13.8K, no fevers Hgb 7.9 this morning (7.4 --> 7.7 --> 7.9) Renal function normal, sCr - 0.79, good UO No electrolyte derangements Ileosotomy with 175 ccs out On regular diet, tolerating PT following but she declined to work with them yesterday She also had contrast enema study this morning which was concerning for anastomotic stricture   Vital signs in last 24 hours: [min-max] current  Temp:  [99.3 F (37.4 C)-99.7 F (37.6 C)] 99.3 F (37.4 C) (08/08 2325) Pulse Rate:  [88-95] 88 (08/08 2325) Resp:  [15-17] 16 (08/08 2325) BP: (105-128)/(54-63) 128/63 (08/08 2325) SpO2:  [93 %-97 %] 96 % (08/08 2325)     Height: 4\' 10"  (147.3 cm) Weight: 86.2 kg BMI (Calculated): 39.72   Intake/Output last 2 shifts:  08/08 0701 - 08/09 0700 In: 0  Out: 1075 [Urine:900; Stool:175]   Physical Exam:  Constitutional: alert, cooperative and no distress  Respiratory: breathing non-labored at rest  Cardiovascular: regular rate and sinus rhythm  Gastrointestinal: Soft, non-distended, non-tender. Ileostomy in RLQ is pink, patent, with liquid stool in bag. Integumentary: Midline incision has 1.5 cm opening at the level of the umbilicus, with some minimal drainage  Labs:  CBC Latest Ref Rng & Units 05/16/2020 05/15/2020 05/14/2020  WBC 4.0 - 10.5 K/uL 13.8(H) 12.6(H) 13.3(H)  Hemoglobin 12.0 - 15.0 g/dL 7.9(L) 7.7(L) 7.4(L)  Hematocrit 36 - 46 % 23.7(L) 23.2(L) 22.8(L)  Platelets 150 - 400 K/uL 685(H) 689(H) 702(H)   CMP Latest Ref Rng & Units 05/16/2020 05/15/2020 05/14/2020  Glucose 70  - 99 mg/dL 128(H) 108(H) 124(H)  BUN 8 - 23 mg/dL 8 7(L) 8  Creatinine 0.44 - 1.00 mg/dL 0.79 0.79 0.92  Sodium 135 - 145 mmol/L 137 134(L) 135  Potassium 3.5 - 5.1 mmol/L 3.7 3.6 3.4(L)  Chloride 98 - 111 mmol/L 106 103 103  CO2 22 - 32 mmol/L 23 22 21(L)  Calcium 8.9 - 10.3 mg/dL 8.1(L) 8.1(L) 8.1(L)  Total Protein 6.5 - 8.1 g/dL - - -  Total Bilirubin 0.3 - 1.2 mg/dL - - -  Alkaline Phos 38 - 126 U/L - - -  AST 15 - 41 U/L - - -  ALT 0 - 44 U/L - - -     Imaging studies:   DG Contrast Enema (05/16/2020) personally reviewed which is concerning for anastomotic stricture, no gross extravasation, and radiologist report reviewed below:  IMPRESSION: Increasing areas of mixed attenuation consistent with postoperative hematomas. No definitive abscess is noted at this time.  Changes consistent with previous surgery and right sided loop ileostomy. No obstructive changes are seen.  New areas of fluid attenuation within the endometrial canal of uncertain significance. These have arisen in the interval from the prior exam and may represent small hematomas. Clinical correlation with the physical exam is recommended. These may be related to small hematomas.   Assessment/Plan:  79 y.o. female 18 days s/p colostomy reversal and diverting loop ileostomy, complicated by pelvic hematomas and anastomotic stricture   - I discussed case with IR after reviewing cultures from aspiration over the weekend and whether or not we should pursue additional  attempts at percutaneous drainage. Their recommendations at this time were for repeat imaging to follow up on these collections prior to proceeding with any drainage. I will order this.    - Will switch to IV Zosyn; Cultures with resistant E coli; reviewed susceptibilities. She does have a penicillin allergy reported as nausea and diarrhea, this was when she was 18 and had PNA. Never had any rash, swelling, trouble swallowing with penicillins  - Okay to  continue diet as tolerates  - Pain control prn; antiemetics prn  - Monitor abdominal examination  - Monitor ileostomy function; record output  - Trend leukocytosis; fever curve  - Mobilization encouraged; PT to follow when amenable  - Medical management of comorbid conditions  - No surgical re-intervention at this time   All of the above findings and recommendations were discussed with the patient, patient's family at bedside, and the medical team, and all of patient's and family's questions were answered to their expressed satisfaction.  -- Edison Simon, PA-C [facility] 05/16/2020, 7:19 AM [phone] M-F: 7am - 4pm

## 2020-05-16 NOTE — Progress Notes (Signed)
P{T returned to room from Ct, pt made comfortable and warm blanket given

## 2020-05-16 NOTE — Care Management Important Message (Signed)
Important Message  Patient Details  Name: Nancy Hickman MRN: [id number] Date of Birth: [birth year]   Medicare Important Message Given:  Yes     Loann Quill 05/16/2020, 1:18 PM

## 2020-05-16 NOTE — Evaluation (Signed)
Physical Therapy Evaluation Patient Details Name: Nancy Hickman MRN: [id number] DOB: [birth year] Today's Date: 05/16/2020   History of Present Illness  Pt is a 79 y.o. female w/ PMH of afib, anemia, arthritis, breast cancer, DM, hiatal hernia, MI, and sepsis. Per MD impression, pt currently presents s/p open colostomy reversal on 7/22 with creation of diverting loop ileostomy due to anastomotic leak intraoperatively and 8/4 began to have LBP, bloody mucus per rectum, and urinary incontinence.  Clinical Impression  Pt was pleasant and motivated to participate during the session. Pt reported significant HA this afternoon, but willing to participate with session. Pt able to perform supine therex w/ min difficulty. Pt mod-I w/ supine-sit w/ increased time and effort and use of hand rails. Pt generally slow w/ mobility 2/2 dizziness upon sitting up; able to sit-to-stand to RW w/ CGA and min cueing for sequencing. Pt able to perform standing marches and side-step ~68ft up to [facility] before having to sit down 2/2 dizziness; CGA maintained. Pt appeared somewhat unsteady in standing, but unable to further assess due to pt symptoms and decreased tolerance of standing this afternoon. Pt required min-A for BLE management w/ return to supine 2/2 pt reported dizziness and nausea. Pt will benefit from PT services in a SNF setting upon discharge to safely address deficits listed in patient problem list for decreased caregiver assistance and eventual return to PLOF.     Follow Up Recommendations SNF;Supervision for mobility/OOB    Equipment Recommendations  Rolling walker with 5" wheels    Recommendations for Other Services       Precautions / Restrictions Precautions Precautions: Fall Restrictions Weight Bearing Restrictions: No Other Position/Activity Restrictions: ileostomy RLQ      Mobility  Bed Mobility Overal bed mobility: Needs Assistance Bed Mobility: Supine to Sit;Sit to Supine     Supine to  sit: Modified independent (Device/Increase time) Sit to supine: Min assist   General bed mobility comments: requires increased time and use of bed rails; min-A w/ sit-to-supine for BLE managment (quick return to supine 2/2 dizziness)  Transfers Overall transfer level: Needs assistance Equipment used: Rolling walker (2 wheeled) Transfers: Sit to/from Stand Sit to Stand: Min guard         General transfer comment: slow with mobility due to reported dizziness upon sitting up. Required increased time and effort. Close CGA 2/2 pt reported dizziness and min cueing for sequencing  Ambulation/Gait Ambulation/Gait assistance: Min guard Gait Distance (Feet): 2 Feet Assistive device: Rolling walker (2 wheeled) Gait Pattern/deviations: Step-to pattern Gait velocity: decreased   General Gait Details: slow with mobility and limited by dizziness, able to side-step ~37ft up to [facility] with close CGA before returning to sit  Stairs            Wheelchair Mobility    Modified Rankin (Stroke Patients Only)       Balance Overall balance assessment: Mild deficits observed, not formally tested                                           Pertinent Vitals/Pain Pain Assessment: Faces Faces Pain Scale: Hurts even more Pain Descriptors / Indicators: Headache Pain Intervention(s): Monitored during session;Limited activity within patient's tolerance;Repositioned    Home Living Family/patient expects to be discharged to:: Private residence Living Arrangements: Alone Available Help at Discharge: Family;Available PRN/intermittently (daughter lives close) Type of Home: House Home Access: Stairs to  enter Entrance Stairs-Rails:  (yes) Entrance Stairs-Number of Steps: 4   Home Equipment: Walker - 4 wheels;Cane - single point      Prior Function Level of Independence: Independent         Comments: pt still runs most of errands, etc     Hand Dominance         Extremity/Trunk Assessment   Upper Extremity Assessment Upper Extremity Assessment: Generalized weakness    Lower Extremity Assessment Lower Extremity Assessment: Generalized weakness       Communication   Communication: No difficulties  Cognition Arousal/Alertness: Awake/alert Behavior During Therapy: WFL for tasks assessed/performed Overall Cognitive Status: Within Functional Limits for tasks assessed                                 General Comments: A&Ox4      General Comments      Exercises Total Joint Exercises Ankle Circles/Pumps: AROM;Strengthening;Both;10 reps Straight Leg Raises: AROM;Strengthening;Both;10 reps Marching in Standing: AROM;Strengthening;Both;10 reps;Standing   Assessment/Plan    PT Assessment Patient needs continued PT services  PT Problem List Decreased strength;Decreased activity tolerance;Decreased balance;Decreased mobility;Decreased range of motion;Decreased knowledge of use of DME       PT Treatment Interventions Gait training;DME instruction;Stair training;Functional mobility training;Therapeutic activities;Therapeutic exercise;Balance training;Patient/family education    PT Goals (Current goals can be found in the Care Plan section)  Acute Rehab PT Goals Patient Stated Goal: to feel better PT Goal Formulation: With patient Time For Goal Achievement: 05/29/20 Potential to Achieve Goals: Fair    Frequency Min 2X/week   Barriers to discharge Decreased caregiver support grandaughter previously available to live with pt and assist 24/7 but recently moved away. Daughter lives nearby but available PRN    Co-evaluation               AM-PAC PT "6 Clicks" Mobility  Outcome Measure Help needed turning from your back to your side while in a flat bed without using bedrails?: A Little Help needed moving from lying on your back to sitting on the side of a flat bed without using bedrails?: A Little Help needed moving to  and from a bed to a chair (including a wheelchair)?: A Lot Help needed standing up from a chair using your arms (e.g., wheelchair or bedside chair)?: A Little Help needed to walk in hospital room?: A Lot Help needed climbing 3-5 steps with a railing? : Total 6 Click Score: 14    End of Session Equipment Utilized During Treatment: Gait belt Activity Tolerance: Other (comment) (limited by dizziness) Patient left: in bed;with call bell/phone within reach;with bed alarm set;with SCD's reapplied Nurse Communication: Mobility status PT Visit Diagnosis: Muscle weakness (generalized) (M62.81);Difficulty in walking, not elsewhere classified (R26.2);Dizziness and giddiness (R42);Unsteadiness on feet (R26.81)    Time: 1345 (and from [id number])-1407 PT Time Calculation (min) (ACUTE ONLY): 22 min   Charges:              Jaimon Bugaj SPT 05/16/20, 2:55 PM

## 2020-05-16 NOTE — Progress Notes (Signed)
PT Cancellation Note  Patient Details Name: Nancy Hickman MRN: [id number] DOB: [birth year]   Cancelled Treatment:    Reason Eval/Treat Not Completed: Patient's level of consciousness: Per nursing pt recently given injection of dilaudid and is not cognitively appropriate for PT evaluation at this time.  Nursing requested to attempt evaluation later this date as appropriate.    Linus Salmons PT, DPT 05/16/20, 10:18 AM

## 2020-05-16 NOTE — Consult Note (Signed)
Pharmacy Antibiotic Note  Nancy Hickman is a 79 y.o. female admitted on 05/13/2020 with Intra-abdominal infection.  Pharmacy has been consulted for Zosyn dosing.  Discussed historical "allergy" of nausea and vomiting with provider, who discussed with patient.  Per patient, was a teenager and was po abx - will continue with Zosyn plan.  Plan: Zosyn 3.375g IV q8h (4 hour infusion).  Height: 4\' 10"  (147.3 cm) Weight: 86.2 kg (190 lb) IBW/kg (Calculated) : 40.9  Temp (24hrs), Avg:99.5 F (37.5 C), Min:99.3 F (37.4 C), Max:99.7 F (37.6 C)  Recent Labs  Lab 05/13/20 1007 05/14/20 0442 05/15/20 0418 05/16/20 0419  WBC 13.0* 13.3* 12.6* 13.8*  CREATININE 0.81 0.92 0.79 0.79    Estimated Creatinine Clearance: 54 mL/min (by C-G formula based on SCr of 0.79 mg/dL).    Allergies  Allergen Reactions  . Doxycycline Rash and Other (See Comments)    "Water blisters"  . Lorcet Plus [Hydrocodone-Acetaminophen] Nausea And Vomiting  . Penicillins Nausea And Vomiting and Other (See Comments)    Vomiting to the point of dehydration.  . Chlorhexidine   . Erythromycin Base Nausea And Vomiting  . Neomycin Nausea And Vomiting  . Statins Other (See Comments)    Muscle pain.    Antimicrobials this admission: Cipro/Metronidazole 8/7>8/9 Zosyn 8/9>>   Dose adjustments this admission: none  Microbiology results: 8/6 Anaerobic culture > E Coli Zosyn sensitive (cipro resistant)  Thank you for allowing pharmacy to be a part of this patient's care.  Lu Duffel, PharmD, BCPS Clinical Pharmacist 05/16/2020 11:52 AM

## 2020-05-17 LAB — CBC
HCT: 25.5 % — ABNORMAL LOW (ref 36.0–46.0)
Hemoglobin: 8.4 g/dL — ABNORMAL LOW (ref 12.0–15.0)
MCH: 26.9 pg (ref 26.0–34.0)
MCHC: 32.9 g/dL (ref 30.0–36.0)
MCV: 81.7 fL (ref 80.0–100.0)
Platelets: 731 10*3/uL — ABNORMAL HIGH (ref 150–400)
RBC: 3.12 MIL/uL — ABNORMAL LOW (ref 3.87–5.11)
RDW: 16.9 % — ABNORMAL HIGH (ref 11.5–15.5)
WBC: 12.6 10*3/uL — ABNORMAL HIGH (ref 4.0–10.5)
nRBC: 1.1 % — ABNORMAL HIGH (ref 0.0–0.2)

## 2020-05-17 LAB — BASIC METABOLIC PANEL
Anion gap: 9 (ref 5–15)
BUN: 8 mg/dL (ref 8–23)
CO2: 23 mmol/L (ref 22–32)
Calcium: 8.5 mg/dL — ABNORMAL LOW (ref 8.9–10.3)
Chloride: 105 mmol/L (ref 98–111)
Creatinine, Ser: 0.84 mg/dL (ref 0.44–1.00)
GFR calc Af Amer: >60 mL/min (ref >60)
GFR calc non Af Amer: >60 mL/min (ref >60)
Glucose, Bld: 124 mg/dL — ABNORMAL HIGH (ref 70–99)
Potassium: 3.8 mmol/L (ref 3.5–5.1)
Sodium: 137 mmol/L (ref 135–145)

## 2020-05-17 LAB — SARS CORONAVIRUS 2 BY RT PCR (HOSPITAL ORDER, PERFORMED IN ~~LOC~~ HOSPITAL LAB): SARS Coronavirus 2: NEGATIVE

## 2020-05-17 MED ORDER — SODIUM CHLORIDE 0.9% FLUSH
10.0000 mL | Freq: Two times a day (BID) | INTRAVENOUS | Status: DC
  Administered 2020-05-17 – 2020-05-22 (×9): 10 mL

## 2020-05-17 MED ORDER — SODIUM CHLORIDE 0.9% FLUSH: 10.0000 mL | INTRAVENOUS | Status: DC | PRN

## 2020-05-17 NOTE — Progress Notes (Signed)
[place] Hospital Day(s): 4.   Interval History:  Patient seen and examined no acute events or new complaints overnight.  Patient reports she is feeling better, still with some lower abdominal soreness No nausea or emesis Leukocytosis improved this morning to 12.6K, no fevers Renal function normal, sCr - 0.84, good UO No electrolyte derangement NPO as precaution this morning, ileostomy with 750ccs out in last 24 hours CT A/P on 08/09 concerning for enlarging fluid collection near anastomosis, plan for drain pending IR availability Worked with PT; recommending SNF   Vital signs in last 24 hours: [min-max] current  Temp:  [98.2 F (36.8 C)-98.4 F (36.9 C)] 98.4 F (36.9 C) (08/10 0011) Pulse Rate:  [74-96] 74 (08/10 0011) Resp:  [16] 16 (08/10 0011) BP: (103-108)/(51-58) 106/58 (08/10 0011) SpO2:  [94 %-98 %] 94 % (08/10 0011)     Height: 4\' 10"  (147.3 cm) Weight: 86.2 kg BMI (Calculated): 39.72   Intake/Output last 2 shifts:  08/09 0701 - 08/10 0700 In: 957.5 [IV Piggyback:957.5] Out: 1800 [Urine:1050; Stool:750]   Physical Exam:  Constitutional: alert, cooperative and no distress  Respiratory: breathing non-labored at rest  Cardiovascular: regular rate and sinus rhythm  Gastrointestinal: Soft, non-distended, non-tender.Ileostomy in RLQ is pink, patent, with liquid stool in bag. Integumentary: Midline incision has 1.5 cm opening at the level of the umbilicus, with some minimal drainage  Labs:  CBC Latest Ref Rng & Units 05/17/2020 05/16/2020 05/15/2020  WBC 4.0 - 10.5 K/uL 12.6(H) 13.8(H) 12.6(H)  Hemoglobin 12.0 - 15.0 g/dL 8.4(L) 7.9(L) 7.7(L)  Hematocrit 36 - 46 % 25.5(L) 23.7(L) 23.2(L)  Platelets 150 - 400 K/uL 731(H) 685(H) 689(H)   CMP Latest Ref Rng & Units 05/17/2020 05/16/2020 05/15/2020  Glucose 70 - 99 mg/dL 124(H) 128(H) 108(H)  BUN 8 - 23 mg/dL 8 8 7(L)  Creatinine 0.44 - 1.00 mg/dL 0.84 0.79 0.79  Sodium 135 - 145  mmol/L 137 137 134(L)  Potassium 3.5 - 5.1 mmol/L 3.8 3.7 3.6  Chloride 98 - 111 mmol/L 105 106 103  CO2 22 - 32 mmol/L 23 23 22   Calcium 8.9 - 10.3 mg/dL 8.5(L) 8.1(L) 8.1(L)  Total Protein 6.5 - 8.1 g/dL - - -  Total Bilirubin 0.3 - 1.2 mg/dL - - -  Alkaline Phos 38 - 126 U/L - - -  AST 15 - 41 U/L - - -  ALT 0 - 44 U/L - - -    Imaging studies:   CT Abdomen/Pelvis (05/16/2020) personally reviewed with air-fluid collection near the anastomosis, and radiologist report reviewed:  IMPRESSION: 6 cm collection in the pelvic cul-de-sac along adjacent to the rectosigmoid surgical anastomosis is stable in size, but shows new internal gas. This is suspicious for anastomotic leak or abscess.  Stable rim enhancing fluid collection in posterior right lower quadrant, which could represent a postop abscess or hematoma.  New linear fluid and gas collection extending in the subcutaneous tissues of the left paraumbilical abdominal wall, consistent with small abscess or fistula.  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 Stable small calcified uterine fibroid.  Stable large hiatal hernia.  Mild hepatic steatosis.   Assessment/Plan:  79 y.o. female with s/p colostomy reversal and diverting loop ileostomy, complicated by pelvic hematomas and anastomotic stricture   - We will discuss with IR regarding need for drainage. Unfortunately there is no coverage at [facility] today for this. We will plan on discussing this via phone with IR today and facilitate plan accordingly.    -  NPO for now  - Continue IV ABx (Zosyn); she may need IV Abx at home  - Pain control prn; antiemetics prn             - Monitor abdominal examination             - Monitor ileostomy function; record output             - Trend leukocytosis; fever curve             - Mobilization encouraged; PT following; recommending SNF             - Medical management of comorbid conditions             - No surgical  re-intervention at this time   All of the above findings and recommendations were discussed with the patient, patient's family at bedside, and the medical team, and all of patient's and family's questions were answered to their expressed satisfaction.  -- Edison Simon, PA-C [facility] 05/17/2020, 7:41 AM [phone] M-F: 7am - 4pm

## 2020-05-17 NOTE — Plan of Care (Signed)

## 2020-05-17 NOTE — Progress Notes (Signed)
Supervising Physician: Dr. Golden Circle  Patient Status: [facility] - In-pt  Subjective: Follow up to pelvic fluid collection aspiration by Dr. Anselm Pancoast on 8/7. Presumed hematoma Cultures now positive for E. Coli Repeat imaging shows stability in size but new air in collection. IR is asked to consider drain placement. PMHx, meds, labs, imaging, allergies reviewed. Feels ok. Primary c/o pain in low pelvis. Did not tolerate BE very well.    Objective: Physical Exam: BP (!) 106/56 (BP Location: Left Arm)   Pulse 84   Temp 98.3 F (36.8 C) (Oral)   Resp 18   Ht 4\' 10"  (1.473 m)   Wt 86.2 kg   LMP  (LMP Unknown)   SpO2 94%   BMI 39.71 kg/m  NAD Lungs: CTA Abd: soft, NT   Current Facility-Administered Medications:  .  acetaminophen (TYLENOL) tablet 1,000 mg, 1,000 mg, Oral, Q6H PRN, Piscoya, Jose, MD, 1,000 mg at 05/14/20 0856 .  diphenhydrAMINE (BENADRYL) injection 12.5 mg, 12.5 mg, Intravenous, Q6H PRN, Piscoya, Jose, MD, 12.5 mg at 05/17/20 0113 .  fluticasone (FLONASE) 50 MCG/ACT nasal spray 1-2 spray, 1-2 spray, Each Nare, Daily PRN, Piscoya, Jose, MD .  HYDROmorphone (DILAUDID) injection 0.5 mg, 0.5 mg, Intravenous, Q4H PRN, Piscoya, Jose, MD, 0.5 mg at 05/16/20 0941 .  ketorolac (TORADOL) 15 MG/ML injection 15 mg, 15 mg, Intravenous, Q6H PRN, Piscoya, Jose, MD, 15 mg at 05/16/20 1803 .  metoprolol succinate (TOPROL-XL) 24 hr tablet 25 mg, 25 mg, Oral, QPM, Piscoya, Jose, MD, 25 mg at 05/16/20 1722 .  ondansetron (ZOFRAN-ODT) disintegrating tablet 4 mg, 4 mg, Oral, Q6H PRN, 4 mg at 05/14/20 2311 **OR** ondansetron (ZOFRAN) injection 4 mg, 4 mg, Intravenous, Q6H PRN, Piscoya, Jose, MD, 4 mg at 05/16/20 1803 .  oxyCODONE (Oxy IR/ROXICODONE) immediate release tablet 5-10 mg, 5-10 mg, Oral, Q4H PRN, Piscoya, Jose, MD, 5 mg at 05/13/20 2353 .  pantoprazole (PROTONIX) injection 40 mg, 40 mg, Intravenous, QHS, Piscoya, Jose, MD, 40 mg at 05/16/20 2212 .  piperacillin-tazobactam (ZOSYN)  IVPB 3.375 g, 3.375 g, Intravenous, Q8H, Shanlever, Pierce Crane, RPH, Last Rate: 12.5 mL/hr at 05/17/20 0528, 3.375 g at 05/17/20 0528 .  psyllium (HYDROCIL/METAMUCIL) packet 1 packet, 1 packet, Oral, Daily, Piscoya, Jose, MD  Labs: CBC Recent Labs    05/16/20 0419 05/17/20 0456  WBC 13.8* 12.6*  HGB 7.9* 8.4*  HCT 23.7* 25.5*  PLT 685* 731*   BMET Recent Labs    05/16/20 0419 05/17/20 0456  NA 137 137  K 3.7 3.8  CL 106 105  CO2 23 23  GLUCOSE 128* 124*  BUN 8 8  CREATININE 0.79 0.84  CALCIUM 8.1* 8.5*   LFT No results for input(s): PROT, ALBUMIN, AST, ALT, ALKPHOS, BILITOT, BILIDIR, IBILI, LIPASE in the last 72 hours. PT/INR No results for input(s): LABPROT, INR in the last 72 hours.   Studies/Results: CT ABDOMEN PELVIS W CONTRAST  Result Date: 05/16/2020 CLINICAL DATA:  Abdominal pain. Follow-up postop fluid collections. Recent percutaneous catheter drainage. Recent colostomy reversal and diverting loop ileostomy. EXAM: CT ABDOMEN AND PELVIS WITH CONTRAST TECHNIQUE: Multidetector CT imaging of the abdomen and pelvis was performed using the standard protocol following bolus administration of intravenous contrast. CONTRAST:  166mL OMNIPAQUE IOHEXOL 300 MG/ML  SOLN COMPARISON:  05/13/2020 FINDINGS: Lower Chest: No acute findings. Hepatobiliary: No hepatic masses identified. Mild diffuse hepatic steatosis again noted. Prior cholecystectomy. No evidence of biliary obstruction. Pancreas:  No mass or inflammatory changes. Spleen: Surgically absent. Adrenals/Urinary Tract: No  masses identified. Stable small bilateral renal cysts. No evidence of ureteral calculi or hydronephrosis. Unremarkable unopacified urinary bladder. Stomach/Bowel: Large hiatal hernia again noted. Loop ileostomy is again seen in the right lower quadrant. No evidence of bowel obstruction. A new linear fluid and gas collection is seen extending in the subcutaneous tissues of the left paraumbilical anterior abdominal  wall which extends to the skin surface. This measures 4.2 x 1.4 cm on image 43/2 and is consistent with a small abscess or fistula. A fluid and gas collection is again seen in the pelvic cul-de-sac along the anterior aspect of the rectosigmoid surgical anastomosis. This measures 6.1 x 3.8 cm which has not significantly changed, however gas within this collection is new since previous study. This could be due to anastomotic leak or abscess. Vascular/Lymphatic: No pathologically enlarged lymph nodes. No abdominal aortic aneurysm. Reproductive: A small calcified uterine fibroid is again seen in the left lateral corpus. A bilobed fluid collection in the endometrial cavity of the uterus shows mild increase in size, consistent with hydrometros. No adnexal masses are identified. Other: A rim enhancing fluid collection is seen in the posterior right lower quadrant which measures 5.4 x 3.2 cm on image 43/2. This shows no significant change since previous study, and could represent a postop abscess or hematoma. No evidence of ascites. Musculoskeletal:  No suspicious bone lesions identified. IMPRESSION: 6 cm collection in the pelvic cul-de-sac along adjacent to the rectosigmoid surgical anastomosis is stable in size, but shows new internal gas. This is suspicious for anastomotic leak or abscess. Stable rim enhancing fluid collection in posterior right lower quadrant, which could represent a postop abscess or hematoma. New linear fluid and gas collection extending in the subcutaneous tissues of the left paraumbilical abdominal wall, consistent with small abscess or fistula. 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 Stable small calcified uterine fibroid. Stable large hiatal hernia. Mild hepatic steatosis. Electronically Signed   By: Marlaine Hind M.D.   On: 05/16/2020 15:26   DG BE (COLON)W SINGLE CM (SOL OR THIN BA)  Result Date: 05/16/2020 CLINICAL DATA:  Status post colostomy reversal with diverting loop ileostomy  creation. Presents with pelvic pain. Bloody mucus per rectum. EXAM: SINGLE COLUMN BARIUM ENEMA TECHNIQUE: Initial scout AP supine abdominal image obtained to insure adequate colon cleansing. Barium was introduced into the colon in a retrograde fashion and refluxed from the rectum to the cecum. Spot images of the colon followed by overhead radiographs were obtained. FLUOROSCOPY TIME:  Fluoroscopy Time:  1 minutes 6 seconds Radiation Exposure Index (if provided by the fluoroscopic device): 23.1 mGy Number of Acquired Spot Images: 3 COMPARISON:  None. FINDINGS: KUB: There is no bowel dilatation to suggest obstruction. There is no evidence of pneumoperitoneum, portal venous gas or pneumatosis. There are no pathologic calcifications along the expected course of the ureters. There are coarse calcifications in the pelvis along the left side likely reflecting uterine fibroids. The osseous structures are unremarkable. SINGLE CONTRAST BARIUM ENEMA: An enema catheter was inserted and retention balloon distended under fluoroscopy. There are small hemorrhoids present. Water-soluble contrast (diluted Omnipaque 300) was then introduced. Patient had extreme difficulty retaining the catheter tip secondary to pain and discomfort. Distension of the rectum demonstrates no filling defects, polyps or mass lesion. No annular constricting lesion of the rectum. Abrupt tapering at the rectosigmoid anastomosis. Trace amount of contrast traverses past the rectosigmoid anastomosis. No definite extraluminal contrast. IMPRESSION: Limited evaluation of the rectosigmoid anastomosis as the patient is unable to retain the  enema catheter upon multiple attempts. Patient is under extreme discomfort and pain with the catheter tip in place. Distension of the rectum demonstrates no filling defects, polyps or mass lesion. No annular constricting lesion of the rectum. 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 No definite extraluminal contrast, but there is a large amount of contrast which leaked out around the enema catheter making it difficult to completely exclude a small anastomotic leak. If there is persistent concern for and anastomotic leak, recommend a CT of the pelvis to visualized tiny amounts of contrast which may have extravasated. These results were called by telephone at the time of interpretation on 05/16/2020 at 9:04 am to provider [facility] , who verbally acknowledged these results. Electronically Signed   By: Kathreen Devoid   On: 05/16/2020 09:05    Assessment/Plan: Pelvic fluid collection, infected hematoma vs evolving abscess from anastomotic leak. Imaging and case reviewed with Dr. Golden Circle, who has discussed with surgical team. Plan for image guided drainage of pelvic fluid collection tomorrow. Pt understands plan. Risks and benefits discussed with the patient including bleeding, infection, damage to adjacent structures, bowel perforation/fistula connection, and sepsis.  All of the patient's questions were answered, patient is agreeable to proceed. Consent signed and in chart.      LOS: 4 days   I spent a total of 20 minutes in face to face in clinical consultation, greater than 50% of which was counseling/coordinating care for pelvic abscess drainage  Ascencion Dike PA-C 05/17/2020 12:46 PM

## 2020-05-17 NOTE — TOC Progression Note (Signed)
Transition of Care [facility]) - Progression Note    Patient Details  Name: Nancy Hickman MRN: [id number] Date of Birth: December 07, 1940  Transition [facility]) CM/SW Contact  Alannis Hsia, Gardiner Rhyme, LCSW Phone Number: 05/17/2020, 1:39 PM  Clinical Narrative: Met with pt to discuss discharge needs. She reports daughter wants her to go to [facility] for rehab, but pt wants to go home and manage. 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 She moved here [place] last year to be closer to her daughter. She has two daughter's in [facility] that have too many of their own problems to assist her. She reports she still has more tests here and realizes needs to get up with PT and move, so she can be independent. She was prior to admission and wants to be again. Discussed home health and equipment that can be arranged. Will continue to work on safe discharge plan for pt.          Expected Discharge Plan and Services                                                 Social Determinants of Health (SDOH) Interventions    Readmission Risk Interventions No flowsheet data found.

## 2020-05-17 NOTE — Progress Notes (Signed)
Peripherally Inserted Central Catheter Placement  The IV Nurse has discussed with the patient and/or persons authorized to consent for the patient, the purpose of this procedure and the potential benefits and risks involved with this procedure.  The benefits include less needle sticks, lab draws from the catheter, and the patient may be discharged home with the catheter. Risks include, but not limited to, infection, bleeding, blood clot (thrombus formation), and puncture of an artery; nerve damage and irregular heartbeat and possibility to perform a PICC exchange if needed/ordered by physician.  Alternatives to this procedure were also discussed.  Bard Power PICC patient education guide, fact sheet on infection prevention and patient information card has been provided to patient /or left at bedside.    PICC Placement Documentation  PICC Single Lumen 05/17/20 PICC Left Brachial 41 cm 0 cm (Active)  Indication for Insertion or Continuance of Line Prolonged intravenous therapies 05/17/20 1520  Exposed Catheter (cm) 0 cm 05/17/20 1520  Site Assessment Clean;Dry;Intact 05/17/20 1520  Line Status Flushed;Saline locked;Blood return noted 05/17/20 1520  Dressing Type Transparent;Securing device 05/17/20 1520  Dressing Status Clean;Dry;Intact;Antimicrobial disc in place 05/17/20 1520  Safety Lock Not Applicable 10/93/23 5573  Dressing Intervention New dressing 05/17/20 1520  Dressing Change Due 05/24/20 05/17/20 [place], [place] 05/17/2020, 3:46 PM

## 2020-05-17 NOTE — Progress Notes (Signed)
Pt eating food. Pt denies pain. IV abx started. Call bell within reach

## 2020-05-17 NOTE — Progress Notes (Signed)
Pt laying in bed resting. Pt slightly in pain 3/10, denies medication, Pt signed CT consent, per pt MD went over procedure yesterday. RN repositioned pt in bed. Pt denies any fruther needs at this time, call bell within reach. RN number on pt's board.

## 2020-05-18 ENCOUNTER — Inpatient Hospital Stay: Payer: Medicare Other

## 2020-05-18 LAB — BASIC METABOLIC PANEL
Anion gap: 9 (ref 5–15)
BUN: 9 mg/dL (ref 8–23)
CO2: 23 mmol/L (ref 22–32)
Calcium: 8.4 mg/dL — ABNORMAL LOW (ref 8.9–10.3)
Chloride: 105 mmol/L (ref 98–111)
Creatinine, Ser: 0.91 mg/dL (ref 0.44–1.00)
GFR calc Af Amer: >60 mL/min (ref >60)
GFR calc non Af Amer: >60 mL/min (ref >60)
Glucose, Bld: 125 mg/dL — ABNORMAL HIGH (ref 70–99)
Potassium: 3.6 mmol/L (ref 3.5–5.1)
Sodium: 137 mmol/L (ref 135–145)

## 2020-05-18 LAB — CBC
HCT: 27 % — ABNORMAL LOW (ref 36.0–46.0)
Hemoglobin: 8.5 g/dL — ABNORMAL LOW (ref 12.0–15.0)
MCH: 26.6 pg (ref 26.0–34.0)
MCHC: 31.5 g/dL (ref 30.0–36.0)
MCV: 84.4 fL (ref 80.0–100.0)
Platelets: 739 10*3/uL — ABNORMAL HIGH (ref 150–400)
RBC: 3.2 MIL/uL — ABNORMAL LOW (ref 3.87–5.11)
RDW: 16.5 % — ABNORMAL HIGH (ref 11.5–15.5)
WBC: 12.1 10*3/uL — ABNORMAL HIGH (ref 4.0–10.5)
nRBC: 1.5 % — ABNORMAL HIGH (ref 0.0–0.2)

## 2020-05-18 MED ORDER — FENTANYL CITRATE (PF) 100 MCG/2ML IJ SOLN
INTRAMUSCULAR | Status: AC
  Filled 2020-05-18: qty 2

## 2020-05-18 MED ORDER — MIDAZOLAM HCL 2 MG/2ML IJ SOLN
INTRAMUSCULAR | Status: AC
  Filled 2020-05-18: qty 2

## 2020-05-18 MED ORDER — KETOROLAC TROMETHAMINE 15 MG/ML IJ SOLN
15.0000 mg | Freq: Four times a day (QID) | INTRAMUSCULAR | Status: DC | PRN
  Administered 2020-05-20 – 2020-05-22 (×5): 15 mg via INTRAVENOUS
  Filled 2020-05-18 (×5): qty 1

## 2020-05-18 MED ORDER — MIDAZOLAM HCL 2 MG/2ML IJ SOLN
INTRAMUSCULAR | Status: DC | PRN
  Administered 2020-05-18: 0.5 mg via INTRAVENOUS

## 2020-05-18 MED ORDER — FENTANYL CITRATE (PF) 100 MCG/2ML IJ SOLN
INTRAMUSCULAR | Status: DC | PRN
  Administered 2020-05-18 (×3): 25 ug via INTRAVENOUS

## 2020-05-18 MED ORDER — ENOXAPARIN SODIUM 40 MG/0.4ML ~~LOC~~ SOLN
40.0000 mg | SUBCUTANEOUS | Status: DC
  Administered 2020-05-18 – 2020-05-22 (×5): 40 mg via SUBCUTANEOUS
  Filled 2020-05-18 (×5): qty 0.4

## 2020-05-18 NOTE — Plan of Care (Signed)

## 2020-05-18 NOTE — Progress Notes (Signed)
Pt denies any pain, laying in bed. CHG wipes done. Pt denies any further needs at this time. Transport here to take pt to CT.

## 2020-05-18 NOTE — Progress Notes (Signed)
[facility] Day(s): 5.   Interval History:  Patient seen and examined no acute events or new complaints overnight.  Patient reports she is doing well, sore in the right buttock s/p percutaneous drain placement No fever, chills, nausea, or emesis Leukocytosis continues to gradually improve; now 12.1K this morning Renal function continues to be normal, sCr - 0.91, good UO No electrolyte derangements Ileosotomy with 150 ccs recorded, she is NPO this morning Worked with PT; recommending SNF  Vital signs in last 24 hours: [min-max] current  Temp:  [98.3 F (36.8 C)-98.7 F (37.1 C)] 98.7 F (37.1 C) (08/10 2334) Pulse Rate:  [84-87] 87 (08/10 2334) Resp:  [17-18] 17 (08/10 2334) BP: (106-118)/(56-62) 118/62 (08/10 2334) SpO2:  [94 %-95 %] 95 % (08/10 2334)     Height: 4\' 10"  (147.3 cm) Weight: 86.2 kg BMI (Calculated): 39.72   Intake/Output last 2 shifts:  08/10 0701 - 08/11 0700 In: 240 [P.O.:240] Out: 150 [Stool:150]   Physical Exam:  Constitutional: alert, cooperative and no distress  Respiratory: breathing non-labored at rest  Cardiovascular: regular rate and sinus rhythm  Gastrointestinal:Soft, non-distended, non-tender.Ileostomy in RLQ ispink, patent, with liquid stool in bag. Drain in the right buttock.  Integumentary:Midline incision has 1.5 cm opening at the level of the umbilicus, with some minimaldrainage  Labs:  CBC Latest Ref Rng & Units 05/18/2020 05/17/2020 05/16/2020  WBC 4.0 - 10.5 K/uL 12.1(H) 12.6(H) 13.8(H)  Hemoglobin 12.0 - 15.0 g/dL 8.5(L) 8.4(L) 7.9(L)  Hematocrit 36 - 46 % 27.0(L) 25.5(L) 23.7(L)  Platelets 150 - 400 K/uL 739(H) 731(H) 685(H)   CMP Latest Ref Rng & Units 05/18/2020 05/17/2020 05/16/2020  Glucose 70 - 99 mg/dL 125(H) 124(H) 128(H)  BUN 8 - 23 mg/dL 9 8 8   Creatinine 0.44 - 1.00 mg/dL 0.91 0.84 0.79  Sodium 135 - 145 mmol/L 137 137 137  Potassium 3.5 - 5.1 mmol/L 3.6 3.8 3.7  Chloride  98 - 111 mmol/L 105 105 106  CO2 22 - 32 mmol/L 23 23 23   Calcium 8.9 - 10.3 mg/dL 8.4(L) 8.5(L) 8.1(L)  Total Protein 6.5 - 8.1 g/dL - - -  Total Bilirubin 0.3 - 1.2 mg/dL - - -  Alkaline Phos 38 - 126 U/L - - -  AST 15 - 41 U/L - - -  ALT 0 - 44 U/L - - -    Imaging studies: No new pertinent imaging studies   Assessment/Plan:  79 y.o. female 20 days s/p colostomy reversal and diverting loop ileostomy, complicated bypelvic hematomas and anastomotic stricture   - Appreciate IR assistance with percutaneous drainage; monitor and record output  - Okay to resume diet  - Continue IV ABx (Soyn); may need IV ABx as outpatient   - Pain control prn; antiemetics prn             - Monitor abdominal examination             - Monitor ileostomy function; record output             - Trend leukocytosis; fever curve             - Mobilization encouraged; PT following and recommending SNF             - Medical management of comorbid conditions             - No surgical re-intervention at this time   All of the above findings and recommendations were discussed with  the patient, patient's family at bedside, and the medical team, and all of patient's and family's questions were answered to their expressed satisfaction.  -- Edison Simon, PA-C [facility] 05/18/2020, 7:23 AM [phone] M-F: 7am - 4pm

## 2020-05-18 NOTE — Progress Notes (Signed)
Patient clinically stable post Abscess drain placement per Dr Golden Circle. Tolerated well, denies complaints at this time. Report given to Lurline Hare on 1A with questions answered post procedure/recovery. Received Versed 0.5 mg along with Fentanyl 75 mcg IV for procedure. JP drain intact with dressing dry/intact to posterior right buttocks.

## 2020-05-18 NOTE — Progress Notes (Signed)
Physical Therapy Treatment Patient Details Name: Nancy Hickman MRN: [id number] DOB: December 22, 1940 Today's Date: 05/18/2020    History of Present Illness Pt is a 79 y.o. female w/ PMH of afib, anemia, arthritis, breast cancer, DM, hiatal hernia, MI, and sepsis. Per MD impression, pt currently presents s/p open colostomy reversal on 7/22 with creation of diverting loop ileostomy due to anastomotic leak intraoperatively and 8/4 began to have LBP, bloody mucus per rectum, and urinary incontinence.    PT Comments    Pt was pleasant and motivated to participate during the session. Pt generally appeared anxious about moving today following recent procedure; reported fairly constant pain at drain insertion site. Pt required supervision w/ supine-sit as well as increased time/effort and use of bed rails. Once in sitting pt began complaining of significant nausea which was a limiting factor for the remainder of session along with fairly frequent dry heaving. Pt was slowly able to sit-to-stand w/ close CGA 2/2 significant nausea and unsteadiness as well as min cueing for sequencing. Pt able to ambulate ~66ft forwards and ~61ft back before returning to sit. Pt then able to ambulate 65ft to recliner w/ slow, small bilateral steps and close CGA throughout. Pt performed multiple sit-to-stand transfers from various height surfaces throughout session. Overall, pt was highly limited by nausea and was unable to demonstrate sufficient mobility for a safe, independent, return home. Pt will benefit from PT services in a SNF setting upon discharge to safely address deficits listed in patient problem list for decreased caregiver assistance and eventual return to PLOF.   Follow Up Recommendations  SNF;Supervision for mobility/OOB     Equipment Recommendations  Rolling walker with 5" wheels    Recommendations for Other Services       Precautions / Restrictions Precautions Precautions: Fall Restrictions Weight Bearing  Restrictions: No Other Position/Activity Restrictions: drain (L buttocks), ileostomy (RLQ)    Mobility  Bed Mobility Overal bed mobility: Needs Assistance Bed Mobility: Supine to Sit     Supine to sit: Supervision     General bed mobility comments: requires increased time and use of bed rails  Transfers Overall transfer level: Needs assistance Equipment used: Rolling walker (2 wheeled) Transfers: Sit to/from Stand Sit to Stand: Min guard         General transfer comment: slow w/ mobility 2/2 significant nausea. Required increased time and effort as well as close CGA 2/2 significant nausea and unsteadiness. Min cueing for sequencing  Ambulation/Gait Ambulation/Gait assistance: Min guard Gait Distance (Feet): 4 Feet; 76ft Assistive device: Rolling walker (2 wheeled) Gait Pattern/deviations: Step-through pattern;Decreased step length - left;Decreased step length - right Gait velocity: decreased   General Gait Details: slow with mobility and limited by nausea, able to ambulate ~37ft forwards and 44ft back before returning to sit 2/2 nausea; then ambulated 80ft to recliner. Short, slow, bilateral steps w/ close CGA throughout   Stairs             Wheelchair Mobility    Modified Rankin (Stroke Patients Only)       Balance Overall balance assessment: Mild deficits observed, not formally tested                                          Cognition Arousal/Alertness: Awake/alert Behavior During Therapy: Anxious Overall Cognitive Status: Within Functional Limits for tasks assessed  General Comments: A&Ox4      Exercises Total Joint Exercises Ankle Circles/Pumps: AROM;Strengthening;Both;10 reps Other Exercises Other Exercises: multiple sit-to-stand transfers from various level surfaces    General Comments        Pertinent Vitals/Pain Pain Score: 7  Pain Location: drain insertion, L buttocks Pain  Descriptors / Indicators: Constant;Aching;Sore Pain Intervention(s): Limited activity within patient's tolerance;Monitored during session;Repositioned;Premedicated before session    Home Living                      Prior Function            PT Goals (current goals can now be found in the care plan section) Progress towards PT goals: Progressing toward goals    Frequency    Min 2X/week      PT Plan Current plan remains appropriate    Co-evaluation              AM-PAC PT "6 Clicks" Mobility   Outcome Measure  Help needed turning from your back to your side while in a flat bed without using bedrails?: A Little Help needed moving from lying on your back to sitting on the side of a flat bed without using bedrails?: A Little Help needed moving to and from a bed to a chair (including a wheelchair)?: A Lot Help needed standing up from a chair using your arms (e.g., wheelchair or bedside chair)?: A Little Help needed to walk in hospital room?: A Lot Help needed climbing 3-5 steps with a railing? : Total 6 Click Score: 14    End of Session Equipment Utilized During Treatment: Gait belt Activity Tolerance: Other (comment) (limited by nausea) Patient left: in chair;with call bell/phone within reach;with SCD's reapplied;with chair alarm set Nurse Communication: Mobility status PT Visit Diagnosis: Muscle weakness (generalized) (M62.81);Difficulty in walking, not elsewhere classified (R26.2);Dizziness and giddiness (R42);Unsteadiness on feet (R26.81)     Time: 2233-6122 PT Time Calculation (min) (ACUTE ONLY): 32 min  Charges:                        Chilton Sallade SPT 05/18/20, 5:31 PM

## 2020-05-18 NOTE — NC FL2 (Signed)
[place] LEVEL OF CARE SCREENING TOOL     IDENTIFICATION  Patient Name: Nancy Hickman Birthdate: 1941-04-11 Sex: female Admission Date (Current Location): 05/13/2020  [place] and [place] Number:  Nancy Hickman [id number] [name] and Address:  [facility], [address] [place], [place] [address]      Provider Number: [id number]  Attending Physician Name and Address:  Olean Ree, MD  Relative Name and Phone Number:  Nancy Hickman [phone]    Current Level of Care: Hospital Recommended Level of Care: [place] Prior Approval Number:    Date Approved/Denied:   PASRR Number: [id number]  Discharge Plan: SNF    Current Diagnoses: Patient Active Problem List   Diagnosis Date Noted  . Pelvic hematoma in female   . Intraabdominal fluid collection 05/13/2020  . 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 Parastomal hernia without obstruction or gangrene   . Incisional hernia, without obstruction or gangrene   . Hiatal hernia with GERD   . Stricture and stenosis of esophagus   . Special screening for malignant neoplasms, colon   . Benign neoplasm of cecum     Orientation RESPIRATION BLADDER Height & Weight     Self, Time, Situation, Place  Normal External catheter Weight: 190 lb (86.2 kg) Height:  4\' 10"  (147.3 cm)  BEHAVIORAL SYMPTOMS/MOOD NEUROLOGICAL BOWEL NUTRITION STATUS      Continent Diet (regular diet thin liquids)  AMBULATORY STATUS COMMUNICATION OF NEEDS Skin   Limited Assist Verbally Surgical wounds                       Personal Care Assistance Level of Assistance  Bathing, Dressing Bathing Assistance: Limited assistance   Dressing Assistance: Limited assistance     Functional Limitations Info             SPECIAL CARE FACTORS FREQUENCY  PT (By licensed PT), OT (By licensed OT)     PT Frequency: 5x week OT Frequency: 5x week            Contractures  Contractures Info: Not present    Additional Factors Info  Code Status, Allergies Code Status Info: Full Code Allergies Info: Erthromycin, Doxycycline, Loraset Plus, Penicillins, Neomycin Statins, Chlorhexidine           Current Medications (05/18/2020):  This is the current hospital active medication list Current Facility-Administered Medications  Medication Dose Route Frequency Provider Last Rate Last Admin  . acetaminophen (TYLENOL) tablet 1,000 mg  1,000 mg Oral Q6H PRN Olean Ree, MD   1,000 mg at 05/14/20 0856  . diphenhydrAMINE (BENADRYL) injection 12.5 mg  12.5 mg Intravenous Q6H PRN Piscoya, Jose, MD   12.5 mg at 05/17/20 2131  . enoxaparin (LOVENOX) injection 40 mg  40 mg Subcutaneous Q24H Piscoya, Jose, MD      . fentaNYL (SUBLIMAZE) 100 MCG/2ML injection           . fentaNYL (SUBLIMAZE) injection   Intravenous PRN Inez Catalina, MD   25 mcg at 05/18/20 0932  . fluticasone (FLONASE) 50 MCG/ACT nasal spray 1-2 spray  1-2 spray Each Nare Daily PRN Piscoya, Jose, MD      . HYDROmorphone (DILAUDID) injection 0.5 mg  0.5 mg Intravenous Q4H PRN Piscoya, Jose, MD   0.5 mg at 05/18/20 1202  . ketorolac (TORADOL) 15 MG/ML injection 15 mg  15 mg Intravenous Q6H PRN Olean Ree, MD   15 mg at 05/17/20 2137  . metoprolol succinate (TOPROL-XL) 24 hr tablet  25 mg  25 mg Oral QPM Piscoya, Jose, MD   25 mg at 05/17/20 1805  . midazolam (VERSED) 2 MG/2ML injection           . midazolam (VERSED) injection   Intravenous PRN Inez Catalina, MD   0.5 mg at 05/18/20 0910  . ondansetron (ZOFRAN-ODT) disintegrating tablet 4 mg  4 mg Oral Q6H PRN Piscoya, Jose, MD   4 mg at 05/14/20 2311   Or  . ondansetron (ZOFRAN) injection 4 mg  4 mg Intravenous Q6H PRN Olean Ree, MD   4 mg at 05/18/20 1402  . oxyCODONE (Oxy IR/ROXICODONE) immediate release tablet 5-10 mg  5-10 mg Oral Q4H PRN Olean Ree, MD   5 mg at 05/13/20 2353  . pantoprazole (PROTONIX) injection 40 mg  40 mg Intravenous QHS Piscoya,  Jose, MD   40 mg at 05/17/20 2131  . piperacillin-tazobactam (ZOSYN) IVPB 3.375 g  3.375 g Intravenous Q8H Lu Duffel, RPH 12.5 mL/hr at 05/18/20 0555 3.375 g at 05/18/20 0555  . psyllium (HYDROCIL/METAMUCIL) packet 1 packet  1 packet Oral Daily Piscoya, Jose, MD      . sodium chloride flush (NS) 0.9 % injection 10-40 mL  10-40 mL Intracatheter Q12H Piscoya, Jose, MD   10 mL at 05/18/20 0840  . sodium chloride flush (NS) 0.9 % injection 10-40 mL  10-40 mL Intracatheter PRN Olean Ree, MD         Discharge Medications: Please see discharge summary for a list of discharge medications.  Relevant Imaging Results:  Relevant Lab Results:   Additional Information JP drain also 8/11  Nancy Hickman, Nancy Rhyme, LCSW

## 2020-05-18 NOTE — Care Management Important Message (Signed)
Important Message  Patient Details  Name: Markeita Alicia MRN: [id number] Date of Birth: [birth year]   Medicare Important Message Given:  Yes     Loann Quill 05/18/2020, 11:04 AM

## 2020-05-18 NOTE — Procedures (Signed)
CT drain of post op hematoma/abscess with 10 F drain  Complications:  None  Blood Loss: none  See dictation in canopy pacs

## 2020-05-19 LAB — CBC
HCT: 26.5 % — ABNORMAL LOW (ref 36.0–46.0)
Hemoglobin: 8.4 g/dL — ABNORMAL LOW (ref 12.0–15.0)
MCH: 26.8 pg (ref 26.0–34.0)
MCHC: 31.7 g/dL (ref 30.0–36.0)
MCV: 84.7 fL (ref 80.0–100.0)
Platelets: 632 10*3/uL — ABNORMAL HIGH (ref 150–400)
RBC: 3.13 MIL/uL — ABNORMAL LOW (ref 3.87–5.11)
RDW: 17 % — ABNORMAL HIGH (ref 11.5–15.5)
WBC: 12.6 10*3/uL — ABNORMAL HIGH (ref 4.0–10.5)
nRBC: 1 % — ABNORMAL HIGH (ref 0.0–0.2)

## 2020-05-19 LAB — AEROBIC/ANAEROBIC CULTURE W GRAM STAIN (SURGICAL/DEEP WOUND)

## 2020-05-19 NOTE — Plan of Care (Signed)
  Problem: Education: Goal: Knowledge of General Education information will improve Description: Including pain rating scale, medication(s)/side effects and non-pharmacologic comfort measures Outcome: Progressing   Problem: Clinical Measurements: Goal: Cardiovascular complication will be avoided Outcome: Progressing   Problem: Coping: Goal: Level of anxiety will decrease Outcome: Progressing   Problem: Pain Managment: Goal: General experience of comfort will improve Outcome: Progressing   

## 2020-05-19 NOTE — Progress Notes (Signed)
Physical Therapy Treatment Patient Details Name: Nancy Hickman MRN: [id number] DOB: May 11, 1941 Today's Date: 05/19/2020    History of Present Illness Pt is a 79 y.o. female w/ PMH of afib, anemia, arthritis, breast cancer, DM, hiatal hernia, MI, and sepsis. Per MD impression, pt currently presents s/p open colostomy reversal on 7/22 with creation of diverting loop ileostomy due to anastomotic leak intraoperatively and 8/4 began to have LBP, bloody mucus per rectum, and urinary incontinence.    PT Comments    Pt was pleasant and motivated to participate during the session. Pt found in supine, continued to complain of pain in L buttocks at drain insertion site, reported 10/10 pain; RN notified and brought meds at end of session. Pt had minimal ability to participate w/ supine therex due to pressure/pain at drain insertion. Pt globally demonstrated mod-I bed mobility w/ extra time and effort. In sitting, pt able to perform LAQ w/ minimal difficulty. Pt performed multiple sit-to-stand transfers to RW throughout session w/ CGA. Immediately upon standing for the first time, pt's knee buckled and pt nearly lost balance but was able to self correct; min cueing provided for hand positioning. Pt able to ambulate back and fourth from EOB w/ RW and CGA ~67ft total before returning to sit, after a brief rest pt was able to ambulate another 64ft. Pt generally ambulated with slow, small, bilateral steps. Pt less limited by nausea this session and able to ambulate further than previous. Pt was repositioned in sidelying at end of session for pressure relief at drain insertion site. Pt will benefit from PT services in a SNF setting upon discharge to safely address deficits listed in patient problem list for decreased caregiver assistance and eventual return to PLOF.   Follow Up Recommendations  SNF;Supervision for mobility/OOB     Equipment Recommendations  Rolling walker with 5" wheels    Recommendations for  Other Services       Precautions / Restrictions Precautions Precautions: Fall Restrictions Weight Bearing Restrictions: No Other Position/Activity Restrictions: drain (L buttocks), ileostomy (RLQ)    Mobility  Bed Mobility Overal bed mobility: Modified Independent             General bed mobility comments: globally mod-I w/ increased time and effort  Transfers Overall transfer level: Needs assistance Equipment used: Rolling walker (2 wheeled) Transfers: Sit to/from Stand Sit to Stand: Min guard         General transfer comment: Required increased time and effort and close CGA. Pt's knee buckled immediately upon standing for the first time this session; pt was able to self correct balance. Min cueing for hand positioning  Ambulation/Gait Ambulation/Gait assistance: Min guard Gait Distance (Feet): 10 Feet; 5 Feet Assistive device: Rolling walker (2 wheeled) Gait Pattern/deviations: Step-through pattern;Decreased step length - left;Decreased step length - right Gait velocity: decreased   General Gait Details: generally slow with ambulation w/ small bilateral steps. Able to walk out and back from EOB 2x totaling 51ft, rest, and then out and back another 71ft total. No nausea w/ standing this session and pt seemed relieved by dec pressure along drain insertion   Stairs             Wheelchair Mobility    Modified Rankin (Stroke Patients Only)       Balance Overall balance assessment: Needs assistance Sitting-balance support: Feet unsupported Sitting balance-Leahy Scale: Good       Standing balance-Leahy Scale: Fair Standing balance comment: One instance of knee buckling immediately upon standing; pt  was able to self correct. Pt appeared slightly hesitant in standing                            Cognition Arousal/Alertness: Awake/alert Behavior During Therapy: WFL for tasks assessed/performed Overall Cognitive Status: Within Functional Limits  for tasks assessed                                 General Comments: A&Ox4      Exercises Total Joint Exercises Ankle Circles/Pumps: AROM;Strengthening;Both;10 reps Marching in Standing: AROM;Strengthening;Both;Standing;5 reps Other Exercises Other Exercises: multiple sit-to-stand transfers from various level surfaces    General Comments        Pertinent Vitals/Pain Pain Assessment: 0-10 Pain Score: 10-Worst pain ever Pain Location: drain insertion, L buttocks Pain Descriptors / Indicators: Constant;Aching;Sore Pain Intervention(s): Limited activity within patient's tolerance;Monitored during session;Repositioned;Patient requesting pain meds-RN notified;RN gave pain meds during session    Home Living                      Prior Function            PT Goals (current goals can now be found in the care plan section) Progress towards PT goals: Progressing toward goals    Frequency    Min 2X/week      PT Plan Current plan remains appropriate    Co-evaluation              AM-PAC PT "6 Clicks" Mobility   Outcome Measure  Help needed turning from your back to your side while in a flat bed without using bedrails?: A Little Help needed moving from lying on your back to sitting on the side of a flat bed without using bedrails?: A Little Help needed moving to and from a bed to a chair (including a wheelchair)?: A Little Help needed standing up from a chair using your arms (e.g., wheelchair or bedside chair)?: A Little Help needed to walk in hospital room?: A Little Help needed climbing 3-5 steps with a railing? : A Lot 6 Click Score: 17    End of Session Equipment Utilized During Treatment: Gait belt Activity Tolerance: Patient limited by pain Patient left: in bed;with call bell/phone within reach;with bed alarm set;with SCD's reapplied;with nursing/sitter in room;Other (comment) (repositioned in sidelying for pain relief) Nurse  Communication: Mobility status;Patient requests pain meds PT Visit Diagnosis: Muscle weakness (generalized) (M62.81);Difficulty in walking, not elsewhere classified (R26.2);Unsteadiness on feet (R26.81);Dizziness and giddiness (R42)     Time: 0981-1914 PT Time Calculation (min) (ACUTE ONLY): 31 min  Charges:         Amylia Collazos SPT 05/19/20, 5:18 PM

## 2020-05-19 NOTE — NC FL2 (Signed)
[place] LEVEL OF CARE SCREENING TOOL     IDENTIFICATION  Patient Name: Annia Gomm Birthdate: [birth year] Sex: female Admission Date (Current Location): 05/13/2020  [place] and [place] Number:  [name] [id number] [place] and Address:  [facility], [address] [place], [place] [address]      Provider Number: [id number]  Attending Physician Name and Address:  Olean Ree, MD  Relative Name and Phone Number:  Levada Dy [phone]    Current Level of Care: Hospital Recommended Level of Care: [place] Prior Approval Number:    Date Approved/Denied:   PASRR Number: [id number]  Discharge Plan: SNF    Current Diagnoses: Patient Active Problem List   Diagnosis Date Noted  . Pelvic hematoma in female   . Intraabdominal fluid collection 05/13/2020  . Colostomy in place [facility]) 04/28/2020  . Parastomal hernia without obstruction or gangrene   . Incisional hernia, without obstruction or gangrene   . Hiatal hernia with GERD   . Stricture and stenosis of esophagus   . Special screening for malignant neoplasms, colon   . Benign neoplasm of cecum     Orientation RESPIRATION BLADDER Height & Weight     Self, Time, Situation, Place  Normal External catheter Weight: 190 lb (86.2 kg) Height:  4\' 10"  (147.3 cm)  BEHAVIORAL SYMPTOMS/MOOD NEUROLOGICAL BOWEL NUTRITION STATUS      Ileostomy Diet (regular diet thin liquids)  AMBULATORY STATUS COMMUNICATION OF NEEDS Skin   Limited Assist Verbally Surgical wounds                       Personal Care Assistance Level of Assistance  Bathing, Dressing Bathing Assistance: Limited assistance   Dressing Assistance: Limited assistance     Functional Limitations Info             SPECIAL CARE FACTORS FREQUENCY  PT (By licensed PT), OT (By licensed OT)     PT Frequency: 5x week OT Frequency: 5x week            Contractures  Contractures Info: Not present    Additional Factors Info  Code Status, Allergies Code Status Info: Full Code Allergies Info: Erthromycin, Doxycycline, Loraset Plus, Penicillins, Neomycin Statins, Chlorhexidine           Current Medications (05/19/2020):  This is the current hospital active medication list Current Facility-Administered Medications  Medication Dose Route Frequency Provider Last Rate Last Admin  . acetaminophen (TYLENOL) tablet 1,000 mg  1,000 mg Oral Q6H PRN Olean Ree, MD   1,000 mg at 05/14/20 0856  . diphenhydrAMINE (BENADRYL) injection 12.5 mg  12.5 mg Intravenous Q6H PRN Piscoya, Jose, MD   12.5 mg at 05/17/20 2131  . enoxaparin (LOVENOX) injection 40 mg  40 mg Subcutaneous Q24H Piscoya, Jose, MD   40 mg at 05/18/20 2234  . fluticasone (FLONASE) 50 MCG/ACT nasal spray 1-2 spray  1-2 spray Each Nare Daily PRN Piscoya, Jose, MD      . HYDROmorphone (DILAUDID) injection 0.5 mg  0.5 mg Intravenous Q4H PRN Piscoya, Jose, MD   0.5 mg at 05/18/20 2236  . ketorolac (TORADOL) 15 MG/ML injection 15 mg  15 mg Intravenous Q6H PRN Piscoya, Jose, MD      . metoprolol succinate (TOPROL-XL) 24 hr tablet 25 mg  25 mg Oral QPM Piscoya, Jose, MD   25 mg at 05/18/20 1811  . ondansetron (ZOFRAN-ODT) disintegrating tablet 4 mg  4 mg Oral Q6H PRN Olean Ree, MD  4 mg at 05/14/20 2311   Or  . ondansetron (ZOFRAN) injection 4 mg  4 mg Intravenous Q6H PRN Olean Ree, MD   4 mg at 05/19/20 1105  . oxyCODONE (Oxy IR/ROXICODONE) immediate release tablet 5-10 mg  5-10 mg Oral Q4H PRN Olean Ree, MD   5 mg at 05/13/20 2353  . pantoprazole (PROTONIX) injection 40 mg  40 mg Intravenous QHS Piscoya, Jose, MD   40 mg at 05/18/20 2236  . piperacillin-tazobactam (ZOSYN) IVPB 3.375 g  3.375 g Intravenous Q8H Lu Duffel, RPH 12.5 mL/hr at 05/19/20 0655 3.375 g at 05/19/20 0655  . psyllium (HYDROCIL/METAMUCIL) packet 1 packet  1 packet Oral Daily Piscoya, Jose, MD      . sodium  chloride flush (NS) 0.9 % injection 10-40 mL  10-40 mL Intracatheter Q12H Piscoya, Jose, MD   10 mL at 05/18/20 2237  . sodium chloride flush (NS) 0.9 % injection 10-40 mL  10-40 mL Intracatheter PRN Olean Ree, MD         Discharge Medications: Please see discharge summary for a list of discharge medications.  Relevant Imaging Results:  Relevant Lab Results:   Additional Information JP drain also 8/11  [place] Fed[place], Gardiner Rhyme, LCSW

## 2020-05-19 NOTE — Consult Note (Signed)
Pharmacy Antibiotic Note  Nancy Hickman is a 79 y.o. female admitted on 05/13/2020 with Intra-abdominal infection.  Pharmacy has been consulted for Zosyn dosing.  Discussed historical "allergy" of nausea and vomiting with provider, who discussed with patient.  Per patient, was a teenager and was po abx - will continue with Zosyn plan.  Update: Day 4 of Zosyn with no noted reaction.   Plan: Continue: Zosyn 3.375g IV q8h (4 hour infusion).  Height: 4\' 10"  (147.3 cm) Weight: 86.2 kg (190 lb) IBW/kg (Calculated) : 40.9  Temp (24hrs), Avg:98.3 F (36.8 C), Min:97.6 F (36.4 C), Max:98.8 F (37.1 C)  Recent Labs  Lab 05/14/20 0442 05/14/20 0442 05/15/20 0418 05/16/20 0419 05/17/20 0456 05/18/20 0344 05/19/20 1046  WBC 13.3*   < > 12.6* 13.8* 12.6* 12.1* 12.6*  CREATININE 0.92  --  0.79 0.79 0.84 0.91  --    < > = values in this interval not displayed.    Estimated Creatinine Clearance: 47.5 mL/min (by C-G formula based on SCr of 0.91 mg/dL).    Allergies  Allergen Reactions  . Doxycycline Rash and Other (See Comments)    "Water blisters"  . Lorcet Plus [Hydrocodone-Acetaminophen] Nausea And Vomiting  . Penicillins Nausea And Vomiting and Other (See Comments)    Vomiting to the point of dehydration.  . Chlorhexidine   . Erythromycin Base Nausea And Vomiting  . Neomycin Nausea And Vomiting  . Statins Other (See Comments)    Muscle pain.    Antimicrobials this admission: Cipro/Metronidazole 8/7>8/9 Zosyn 8/9>>   Dose adjustments this admission: none  Microbiology results: 8/6 Anaerobic culture > E Coli Zosyn sensitive (cipro resistant)  Thank you for allowing pharmacy to be a part of this patient's care.  Lu Duffel, PharmD, BCPS Clinical Pharmacist 05/19/2020 1:23 PM

## 2020-05-19 NOTE — Progress Notes (Signed)
[place] Hospital Day(s): 6.  Interval History:  Patient seen and examined no acute events or new complaints overnight.  Patient reports she is feeling good this morning. Still with intermittent abdominal/back pain with passing mucus per rectum, but not as severe. otherwise only additional complaint is pain at drain site.  No fever, chills, nausea, or emesis Labs not drawn this morning, CBC pending Ileostomy with 625 ccs out Pelvic drain placed by IR yesterday, output appears to be old bloody fluid, quantity not recorded  Tolerating regular diet Worked with PT; recommending SNF  Vital signs in last 24 hours: [min-max] current  Temp:  [97.6 F (36.4 C)-98.8 F (37.1 C)] 98.8 F (37.1 C) (08/12 0812) Pulse Rate:  [80-92] 92 (08/12 0812) Resp:  [15-27] 16 (08/12 0812) BP: (104-129)/(50-103) 118/63 (08/12 0812) SpO2:  [94 %-100 %] 95 % (08/12 0812)     Height: 4\' 10"  (147.3 cm) Weight: 86.2 kg BMI (Calculated): 39.72   Intake/Output last 2 shifts:  08/11 0701 - 08/12 0700 In: 0  Out: 925 [Urine:300; Stool:625]   Physical Exam:  Constitutional: alert, cooperative and no distress  Respiratory: breathing non-labored at rest  Cardiovascular: regular rate and sinus rhythm  Gastrointestinal:Soft, non-distended, non-tender.Ileostomy in RLQ ispink, patent, with liquid stool in bag. Drain in the right buttock, output appears to be consistent with old blood and serous fluid  Integumentary:Midline incision has 1.5 cm opening at the level of the umbilicus, with some minimaldrainage  Labs:  CBC Latest Ref Rng & Units 05/18/2020 05/17/2020 05/16/2020  WBC 4.0 - 10.5 K/uL 12.1(H) 12.6(H) 13.8(H)  Hemoglobin 12.0 - 15.0 g/dL 8.5(L) 8.4(L) 7.9(L)  Hematocrit 36 - 46 % 27.0(L) 25.5(L) 23.7(L)  Platelets 150 - 400 K/uL 739(H) 731(H) 685(H)   CMP Latest Ref Rng & Units 05/18/2020 05/17/2020 05/16/2020  Glucose 70 - 99 mg/dL 125(H) 124(H) 128(H)  BUN 8 -  23 mg/dL 9 8 8   Creatinine 0.44 - 1.00 mg/dL 0.91 0.84 0.79  Sodium 135 - 145 mmol/L 137 137 137  Potassium 3.5 - 5.1 mmol/L 3.6 3.8 3.7  Chloride 98 - 111 mmol/L 105 105 106  CO2 22 - 32 mmol/L 23 23 23   Calcium 8.9 - 10.3 mg/dL 8.4(L) 8.5(L) 8.1(L)  Total Protein 6.5 - 8.1 g/dL - - -  Total Bilirubin 0.3 - 1.2 mg/dL - - -  Alkaline Phos 38 - 126 U/L - - -  AST 15 - 41 U/L - - -  ALT 0 - 44 U/L - - -     Imaging studies: No new pertinent imaging studies   Assessment/Plan:  79 y.o. female 21 days s/p colostomy reversal and diverting loop ileostomy, complicated bypelvic hematomas and anastomotic stricture   - Will transfer to [place]             - Continue regular diet as tolerates              - Continue IV ABx (Zosyn); may need IV ABx as outpatient   - Continue pelvic drain; monitor and record output             - Pain control prn; antiemetics prn - Monitor abdominal examination - Monitor ileostomy function; record output - Trend leukocytosis; fever curve - Mobilization encouraged; PT following and recommending SNF - Medical management of comorbid conditions - No surgical re-intervention at this time  All of the above findings and recommendations were discussed with the patient, and the medical team, and  all of patient's and family's questions were answered to their expressed satisfaction.  -- Edison Simon, PA-C [facility] 05/19/2020, 8:23 AM [phone] M-F: 7am - 4pm

## 2020-05-19 NOTE — Consult Note (Signed)
[place] Nurse ostomy follow up Stoma type/location: RLQ ileostomy.  Patient in bed and nauseous.  She was independent in colostomy care prior to revision.  She understands the difference between colostomy and ileostomy. We discussed risk of blockage and dehydration.  I have placed barrier ring and 2 piece pouches in her room. Her current pouch is intact with scant stool Stomal assessment/size: 1 1/4" pink and moist stoma.   Peristomal assessment: not assessed  Pouch intact Treatment options for stomal/peristomal skin: barrier ring and 2 piece pouch.  Output scant liquid brown stool Ostomy pouching:2pc. 2 1/4" pouch with barrier ring   Education provided: See above.   Enrolled patient in [place] Start Discharge program: No   Previous admission.  Will not follow at this time.  Please re-consult if needed.  Domenic Moras MSN, RN, FNP-BC CWON Wound, Ostomy, Continence Nurse Pager [phone]

## 2020-05-19 NOTE — Progress Notes (Signed)
Patient transferred to room from other floor.  Family and patient oriented to room and call bell.  No needs verbalized. Patient eating dinner at this time.

## 2020-05-20 DIAGNOSIS — D469 Myelodysplastic syndrome, unspecified: Secondary | ICD-10-CM | POA: Diagnosis not present

## 2020-05-20 DIAGNOSIS — D473 Essential (hemorrhagic) thrombocythemia: Secondary | ICD-10-CM | POA: Diagnosis not present

## 2020-05-20 DIAGNOSIS — A498 Other bacterial infections of unspecified site: Secondary | ICD-10-CM | POA: Diagnosis not present

## 2020-05-20 DIAGNOSIS — N9489 Other specified conditions associated with female genital organs and menstrual cycle: Secondary | ICD-10-CM

## 2020-05-20 LAB — CBC
HCT: 26 % — ABNORMAL LOW (ref 36.0–46.0)
Hemoglobin: 8.5 g/dL — ABNORMAL LOW (ref 12.0–15.0)
MCH: 27.1 pg (ref 26.0–34.0)
MCHC: 32.7 g/dL (ref 30.0–36.0)
MCV: 82.8 fL (ref 80.0–100.0)
Platelets: 630 10*3/uL — ABNORMAL HIGH (ref 150–400)
RBC: 3.14 MIL/uL — ABNORMAL LOW (ref 3.87–5.11)
RDW: 17.4 % — ABNORMAL HIGH (ref 11.5–15.5)
WBC: 13 10*3/uL — ABNORMAL HIGH (ref 4.0–10.5)
nRBC: 0.6 % — ABNORMAL HIGH (ref 0.0–0.2)

## 2020-05-20 MED ORDER — SODIUM CHLORIDE 0.9 % IV SOLN
INTRAVENOUS | Status: DC | PRN
  Administered 2020-05-20 – 2020-05-22 (×3): 500 mL via INTRAVENOUS

## 2020-05-20 MED ORDER — VITAMIN D 25 MCG (1000 UNIT) PO TABS
4000.0000 [IU] | ORAL_TABLET | Freq: Every day | ORAL | Status: DC
  Administered 2020-05-20 – 2020-05-22 (×3): 4000 [IU] via ORAL
  Filled 2020-05-20 (×3): qty 4

## 2020-05-20 MED ORDER — CALCIUM CARBONATE ANTACID 500 MG PO CHEW
1.0000 | CHEWABLE_TABLET | Freq: Every day | ORAL | Status: DC
  Administered 2020-05-20 – 2020-05-23 (×4): 200 mg via ORAL
  Filled 2020-05-20 (×4): qty 1

## 2020-05-20 MED ORDER — PANTOPRAZOLE SODIUM 40 MG PO TBEC
40.0000 mg | DELAYED_RELEASE_TABLET | Freq: Every day | ORAL | Status: DC
  Administered 2020-05-20 – 2020-05-23 (×4): 40 mg via ORAL
  Filled 2020-05-20 (×4): qty 1

## 2020-05-20 MED ORDER — ZINC SULFATE 220 (50 ZN) MG PO CAPS
220.0000 mg | ORAL_CAPSULE | Freq: Every day | ORAL | Status: DC
  Administered 2020-05-20 – 2020-05-22 (×3): 220 mg via ORAL
  Filled 2020-05-20 (×4): qty 1

## 2020-05-20 MED ORDER — VITAMIN B-12 1000 MCG PO TABS
5000.0000 ug | ORAL_TABLET | Freq: Every day | ORAL | Status: DC
  Administered 2020-05-20: 5000 ug via ORAL
  Filled 2020-05-20 (×3): qty 5

## 2020-05-20 NOTE — TOC Progression Note (Signed)
Transition of Care [facility]) - Progression Note    Patient Details  Name: Nancy Hickman MRN: [id number] Date of Birth: 1941-10-01  Transition of Care [facility]) CM/SW Contact  Eileen Stanford, LCSW Phone Number: 05/20/2020, 9:17 AM  Clinical Narrative:  Josem Kaufmann has been started for [employer].          Expected Discharge Plan and Services                                                 Social Determinants of Health (SDOH) Interventions    Readmission Risk Interventions No flowsheet data found.

## 2020-05-20 NOTE — TOC Progression Note (Addendum)
Transition of Care [facility]) - Progression Note    Patient Details  Name: Elienai Gailey MRN: [id number] Date of Birth: 1941-09-05  Transition of Care [facility]) CM/SW Contact  Eileen Stanford, LCSW Phone Number: 05/20/2020, 1:32 PM  Clinical Narrative:   We have received insurance auth, approval good until the 17th.  Auth ID: [id number], Review due 8/17, Care coordinator is Darnelle Bos, fax for continued stay is [phone].         Expected Discharge Plan and Services                                                 Social Determinants of Health (SDOH) Interventions    Readmission Risk Interventions No flowsheet data found.

## 2020-05-20 NOTE — Care Management Important Message (Signed)
Important Message  Patient Details  Name: Nancy Hickman MRN: [id number] Date of Birth: [birth year]   Medicare Important Message Given:  Yes     Loann Quill 05/20/2020, 1:27 PM

## 2020-05-20 NOTE — Progress Notes (Signed)
[facility] Day(s): 7.   Interval History:  Patient seen and examined no acute events or new complaints overnight.  Patient reports biggest complaint is pain in right hip/pelvis secondary to drain Abdominal pain improved No fever, chills, nausea, emesis Still with persistent, but stable, leukocytosis of 13.0K Ileostomy with 850 ccs out Pelvic drain placed by IR yesterday, output appears to be old bloody fluid, quantity not recorded  Tolerating regular diet Worked with PT; recommending SNF  Vital signs in last 24 hours: [min-max] current  Temp:  [98 F (36.7 C)-98.8 F (37.1 C)] 98 F (36.7 C) (08/13 0447) Pulse Rate:  [79-92] 79 (08/13 0447) Resp:  [16-20] 18 (08/13 0447) BP: (107-118)/(48-63) 108/56 (08/13 0447) SpO2:  [92 %-97 %] 92 % (08/13 0447)     Height: 4\' 10"  (147.3 cm) Weight: 86.2 kg BMI (Calculated): 39.72   Intake/Output last 2 shifts:  08/12 0701 - 08/13 0700 In: 240 [P.O.:240] Out: 1250 [Urine:400; Stool:850]   Physical Exam:  Constitutional: alert, cooperative and no distress  Respiratory: breathing non-labored at rest  Cardiovascular: regular rate and sinus rhythm  Gastrointestinal:Soft, non-distended, non-tender.Ileostomy in RLQ ispink, patent, with liquid stool in bag.Drain in the right buttock, output appears to be consistent with old blood and serous fluid Integumentary:Midline incision has 1.5 cm opening at the level of the umbilicus, with some minimaldrainage, no purulence or surrounding erythema   Labs:  CBC Latest Ref Rng & Units 05/20/2020 05/19/2020 05/18/2020  WBC 4.0 - 10.5 K/uL 13.0(H) 12.6(H) 12.1(H)  Hemoglobin 12.0 - 15.0 g/dL 8.5(L) 8.4(L) 8.5(L)  Hematocrit 36 - 46 % 26.0(L) 26.5(L) 27.0(L)  Platelets 150 - 400 K/uL 630(H) 632(H) 739(H)   CMP Latest Ref Rng & Units 05/18/2020 05/17/2020 05/16/2020  Glucose 70 - 99 mg/dL 125(H) 124(H) 128(H)  BUN 8 - 23 mg/dL 9 8 8   Creatinine 0.44 -  1.00 mg/dL 0.91 0.84 0.79  Sodium 135 - 145 mmol/L 137 137 137  Potassium 3.5 - 5.1 mmol/L 3.6 3.8 3.7  Chloride 98 - 111 mmol/L 105 105 106  CO2 22 - 32 mmol/L 23 23 23   Calcium 8.9 - 10.3 mg/dL 8.4(L) 8.5(L) 8.1(L)  Total Protein 6.5 - 8.1 g/dL - - -  Total Bilirubin 0.3 - 1.2 mg/dL - - -  Alkaline Phos 38 - 126 U/L - - -  AST 15 - 41 U/L - - -  ALT 0 - 44 U/L - - -     Imaging studies: No new pertinent imaging studies   Assessment/Plan:  79 y.o. female with 22 dayss/p colostomy reversal and diverting loop ileostomy, complicated bypelvic hematomas and anastomotic stricture   - Continue regular diet as tolerates  - Continue IV ABx (Zosyn); may need IV ABx as outpatient--> will discuss with ID regarding antibiotics for discharge              - Continue pelvic drain; monitor and record output - Pain control prn; antiemetics prn - Monitor abdominal examination - Monitor ileostomy function; record output - Trend leukocytosis; fever curve - Mobilization encouraged; PTfollowing and recommending SNF - Medical management of comorbid conditions - No surgical re-intervention at this time   - Discharge Planning: Repeat CT on Monday (or sooner if clinical concern), pending SNF placement as well  All of the above findings and recommendations were discussed with the patient, patient's family, and the medical team, and all of their questions were answered to their expressed satisfaction.  -- Edison Simon,  PA-C [facility] 05/20/2020, 7:35 AM [phone] M-F: 7am - 4pm
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
## 2020-05-21 LAB — CBC
HCT: 27.7 % — ABNORMAL LOW (ref 36.0–46.0)
Hemoglobin: 9.2 g/dL — ABNORMAL LOW (ref 12.0–15.0)
MCH: 27.5 pg (ref 26.0–34.0)
MCHC: 33.2 g/dL (ref 30.0–36.0)
MCV: 82.7 fL (ref 80.0–100.0)
Platelets: 686 K/uL — ABNORMAL HIGH (ref 150–400)
RBC: 3.35 MIL/uL — ABNORMAL LOW (ref 3.87–5.11)
RDW: 17.7 % — ABNORMAL HIGH (ref 11.5–15.5)
WBC: 12.2 K/uL — ABNORMAL HIGH (ref 4.0–10.5)
nRBC: 0.4 % — ABNORMAL HIGH (ref 0.0–0.2)

## 2020-05-21 NOTE — Progress Notes (Signed)
[facility] Day(s): 8.   Interval History:  Patient seen and examined no acute events or new complaints overnight.  Patient is showing some discouragement today, without significant improvement from day today. No fever, chills, nausea, emesis Still with persistent, but stable, leukocytosis of 12.2 K Ileostomy with output Right lower quadrant drain output appears to be old bloody fluid, quantity not recorded  Tolerating regular diet Worked with PT; recommending SNF, encouraged her that SNF placement will hopefully be temporary as she recovers.  Vital signs in last 24 hours: [min-max] current  Temp:  [97.7 F (36.5 C)-99.8 F (37.7 C)] 97.7 F (36.5 C) (08/14 0444) Pulse Rate:  [75-97] 75 (08/14 0444) Resp:  [16-20] 16 (08/14 0444) BP: (122-130)/(50-75) 122/50 (08/14 0444) SpO2:  [94 %-95 %] 95 % (08/14 0444)     Height: 4\' 10"  (147.3 cm) Weight: 86.2 kg BMI (Calculated): 39.72   Intake/Output last 2 shifts:  08/13 0701 - 08/14 0700 In: 1507.4 [P.O.:720; I.V.:44.8; IV Piggyback:742.6] Out: 1585 [Urine:1250; Drains:10; Stool:325]   Physical Exam:  Constitutional: alert, cooperative and no distress  Respiratory: breathing non-labored at rest  Cardiovascular: regular rate and sinus rhythm  Gastrointestinal:Soft, non-distended, non-tender.Ileostomy in RLQ ispink, patent, with liquid stool in bag.Drain in the right buttock, output appears to be consistent with old blood and serous fluid Integumentary:Midline incision has 1.5 cm opening at the level of the umbilicus, with some 2 to 3 cm in depth, subcutaneous soft tissues appear to be granulating nicely.  Labs:  CBC Latest Ref Rng & Units 05/21/2020 05/20/2020 05/19/2020  WBC 4.0 - 10.5 K/uL 12.2(H) 13.0(H) 12.6(H)  Hemoglobin 12.0 - 15.0 g/dL 9.2(L) 8.5(L) 8.4(L)  Hematocrit 36 - 46 % 27.7(L) 26.0(L) 26.5(L)  Platelets 150 - 400 K/uL 686(H) 630(H) 632(H)   CMP Latest Ref Rng &  Units 05/18/2020 05/17/2020 05/16/2020  Glucose 70 - 99 mg/dL 125(H) 124(H) 128(H)  BUN 8 - 23 mg/dL 9 8 8   Creatinine 0.44 - 1.00 mg/dL 0.91 0.84 0.79  Sodium 135 - 145 mmol/L 137 137 137  Potassium 3.5 - 5.1 mmol/L 3.6 3.8 3.7  Chloride 98 - 111 mmol/L 105 105 106  CO2 22 - 32 mmol/L 23 23 23   Calcium 8.9 - 10.3 mg/dL 8.4(L) 8.5(L) 8.1(L)  Total Protein 6.5 - 8.1 g/dL - - -  Total Bilirubin 0.3 - 1.2 mg/dL - - -  Alkaline Phos 38 - 126 U/L - - -  AST 15 - 41 U/L - - -  ALT 0 - 44 U/L - - -     Imaging studies: No new pertinent imaging studies   Assessment/Plan:  79 y.o. female with 22 dayss/p colostomy reversal and diverting loop ileostomy, complicated bypelvic hematomas and anastomotic stricture   - Continue regular diet as tolerates  - Continue IV ABx (Zosyn); may need IV ABx as outpatient--> will discuss with ID regarding antibiotics for discharge              - Continue pelvic drain; monitor and record output - Pain control prn; antiemetics prn - Monitor abdominal examination - Monitor ileostomy function; record output - Trend leukocytosis; fever curve - Mobilization encouraged; PTfollowing and recommending SNF - Medical management of comorbid conditions - No surgical re-intervention at this time   - Discharge Planning: Repeat CT on Monday (or sooner if clinical concern), pending SNF placement as well  -- Ronny Bacon, M.D., [facility]  05/21/2020 ; 10:07 AM

## 2020-05-21 NOTE — Progress Notes (Signed)
Initial Nutrition Assessment  DOCUMENTATION CODES:   Obesity unspecified  INTERVENTION:  Recommend Ensure Enlive po BID, each supplement provides 350 kcal and 20 grams of protein  Recommend obtaining new weight to assess trends  NUTRITION DIAGNOSIS:   Increased nutrient needs related to acute illness (intra-abdominal fluid s/p open colostomy reversal on 7/22 and diverting loop ileostomy d/t intaoperative anastomotic leak) as evidenced by estimated needs.  GOAL:   Patient will meet greater than or equal to 90% of their needs  MONITOR:   PO intake, Labs, I & O's, Weight trends  REASON FOR ASSESSMENT:   Malnutrition Screening Tool    ASSESSMENT:  RD working remotely.  78 year old female admitted for intra-abdominal fluid collections and blood per rectum s/p open colostomy reversal on 7/22 with creation of diverting loop ileostomy due to anastomotic leak intraoperatively. Past medical history of atrial fibrillation, breast cancer, DM2, GERD, and diverticulitis.  8/6 - admit 8/7 - IR aspiration of pelvic hematoma; cultures positive for E.Coli 8/10 - PICC 8/11 - pelvic drain placed  Unable to reach pt via phone today, per notes pt feeling discouraged without significant improvements from day to day. No surgical re-intervention at this time. PT following and have recommended SNF. Discharge plans pending repeat CT on Monday as well as bed placement.  Patient noted tolerating regular diet, per flowsheets po intake has varied 25-100% (71% average) of the last 8 documented intakes. Given advanced age as well as history of present illness, she is at increased risk for malnutrition. Patient would benefit from oral nutrition supplement to aid with meeting needs, recommend Ensure Enlive BID.   No new weights this admission, recommend obtaining current weight to assess trends.   I/Os: -5107 ml since admit  -77 ml x 24 hrs UOP: 1250 ml x 24 hrs  Drains: 10 ml x 24 hrs    Medications  reviewed and include: Tums, D3, Protonix, Psyllium, B12, Zinc sulfate IVPB: Zosysn Labs reviewed  NUTRITION - FOCUSED PHYSICAL EXAM: Unable to complete at this time, RD working remotely.  Diet Order:   Diet Order            Diet regular Room service appropriate? Yes; Fluid consistency: Thin  Diet effective now                 EDUCATION NEEDS:   No education needs have been identified at this time  Skin:  Skin Assessment: Skin Integrity Issues: Skin Integrity Issues:: Incisions, Other (Comment) Incisions: closed; abdomen (7/22) Other: Drain; right buttocks  Last BM:  8/14 (125 ml)  Height:   Ht Readings from Last 1 Encounters:  05/13/20 4\' 10"  (1.473 m)    Weight:   Wt Readings from Last 1 Encounters:  05/13/20 86.2 kg    BMI:  Body mass index is 39.71 kg/m.  Estimated Nutritional Needs:   Kcal:  1600-1800  Protein:  80-90  Fluid:  >/= 1.6 L   Lajuan Lines, RD, LDN Clinical Nutrition After Hours/Weekend Pager # in [place]

## 2020-05-22 MED ORDER — IOHEXOL 9 MG/ML PO SOLN
500.0000 mL | ORAL | Status: AC
  Administered 2020-05-23: 500 mL via ORAL

## 2020-05-22 NOTE — Progress Notes (Signed)
PT Cancellation Note  Patient Details Name: Nancy Hickman MRN: [id number] DOB: [birth year]   Cancelled Treatment:    Reason Eval/Treat Not Completed: Other (comment)   Pt had just sat down with RN from gait in hallway and room.  RN and pt reported supervision with gait and overall did well with no buckling noted.  Declined further exercises at this time.  Encouraged continuing to walk with nursing staff.  Will continue as appropriate.   [name] 05/22/2020, 11:18 AM

## 2020-05-22 NOTE — Progress Notes (Signed)
[place] Hospital Day(s): 9.   Interval History:  Patient seen and examined no acute events or new complaints overnight.  Patient is appears much brighter today, her daughter is present. No fever, chills, nausea, emesis.  Reports some anorexia, complains of hospital offerings of food are too sweet, but appears to like the Ensure. Still with persistent, but stable, leukocytosis of 12.2 K Ileostomy with output Tolerating regular diet Worked with PT; recommending SNF, encouraged her that SNF placement will hopefully be temporary as she recovers.  Vital signs in last 24 hours: [min-max] current  Temp:  [97.8 F (36.6 C)-98.8 F (37.1 C)] 98.7 F (37.1 C) (08/15 0448) Pulse Rate:  [83-102] 98 (08/15 0448) Resp:  [16-20] 20 (08/15 0448) BP: (103-123)/(60-70) 103/60 (08/15 0448) SpO2:  [93 %-97 %] 93 % (08/15 0448)     Height: 4\' 10"  (147.3 cm) Weight: 86.2 kg BMI (Calculated): 39.72   Intake/Output last 2 shifts:  08/14 0701 - 08/15 0700 In: 130.7 [I.V.:0.3; IV Piggyback:130.4] Out: 1790 [Urine:1250; Drains:10; Stool:530]   Physical Exam:  Constitutional: alert, cooperative and no distress  Respiratory: breathing non-labored at rest  Cardiovascular: regular rate and sinus rhythm  Gastrointestinal:Soft, non-distended, non-tender.Ileostomy in RLQ ispink, patent, with liquid stool in bag.Drain in the right buttock, output appears to be minimal and consistent with old blood and serous fluid Integumentary:Midline incision has 1.5 cm opening at the level of the umbilicus, with some 2 to 3 cm in depth, subcutaneous soft tissues appear to be granulating nicely.  Remains clean and free of drainage.  Labs:  CBC Latest Ref Rng & Units 05/21/2020 05/20/2020 05/19/2020  WBC 4.0 - 10.5 K/uL 12.2(H) 13.0(H) 12.6(H)  Hemoglobin 12.0 - 15.0 g/dL 9.2(L) 8.5(L) 8.4(L)  Hematocrit 36 - 46 % 27.7(L) 26.0(L) 26.5(L)  Platelets 150 - 400 K/uL 686(H) 630(H)  632(H)   CMP Latest Ref Rng & Units 05/18/2020 05/17/2020 05/16/2020  Glucose 70 - 99 mg/dL 125(H) 124(H) 128(H)  BUN 8 - 23 mg/dL 9 8 8   Creatinine 0.44 - 1.00 mg/dL 0.91 0.84 0.79  Sodium 135 - 145 mmol/L 137 137 137  Potassium 3.5 - 5.1 mmol/L 3.6 3.8 3.7  Chloride 98 - 111 mmol/L 105 105 106  CO2 22 - 32 mmol/L 23 23 23   Calcium 8.9 - 10.3 mg/dL 8.4(L) 8.5(L) 8.1(L)  Total Protein 6.5 - 8.1 g/dL - - -  Total Bilirubin 0.3 - 1.2 mg/dL - - -  Alkaline Phos 38 - 126 U/L - - -  AST 15 - 41 U/L - - -  ALT 0 - 44 U/L - - -     Imaging studies: No new pertinent imaging studies   Assessment/Plan:  79 y.o. female with >3wkss/p colostomy reversal and diverting loop ileostomy, complicated bypelvic hematomas and anastomotic stricture   - Continue regular diet as tolerates, may supplement with Ensure or something more appropriate for diabetic.  - Continue IV ABx (Zosyn); may need IV ABx as outpatient--> will discuss with ID regarding antibiotics for discharge              - Continue pelvic drain; monitor and record output - Pain control prn; antiemetics prn - Monitor abdominal examination - Monitor ileostomy function; record output - Trend leukocytosis; fever curve - Mobilization encouraged; PTfollowing and recommending SNF - Medical management of comorbid conditions - No surgical re-intervention at this time   - Discharge Planning: Repeat CT tomorrow, pending SNF placement as well  -- Ronny Bacon,  M.D., [facility]  05/22/2020 ; 11:29 AM

## 2020-05-23 ENCOUNTER — Inpatient Hospital Stay: Payer: Medicare Other

## 2020-05-23 ENCOUNTER — Encounter: Payer: Self-pay | Admitting: Surgery

## 2020-05-23 DIAGNOSIS — D469 Myelodysplastic syndrome, unspecified: Secondary | ICD-10-CM | POA: Diagnosis not present

## 2020-05-23 DIAGNOSIS — N9489 Other specified conditions associated with female genital organs and menstrual cycle: Secondary | ICD-10-CM | POA: Diagnosis not present

## 2020-05-23 DIAGNOSIS — A498 Other bacterial infections of unspecified site: Secondary | ICD-10-CM | POA: Diagnosis not present

## 2020-05-23 DIAGNOSIS — D473 Essential (hemorrhagic) thrombocythemia: Secondary | ICD-10-CM | POA: Diagnosis not present

## 2020-05-23 LAB — COMPREHENSIVE METABOLIC PANEL
ALT: 9 U/L (ref 0–44)
AST: 21 U/L (ref 15–41)
Albumin: 2.9 g/dL — ABNORMAL LOW (ref 3.5–5.0)
Alkaline Phosphatase: 99 U/L (ref 38–126)
Anion gap: 11 (ref 5–15)
BUN: 8 mg/dL (ref 8–23)
CO2: 23 mmol/L (ref 22–32)
Calcium: 8.7 mg/dL — ABNORMAL LOW (ref 8.9–10.3)
Chloride: 101 mmol/L (ref 98–111)
Creatinine, Ser: 0.92 mg/dL (ref 0.44–1.00)
GFR calc Af Amer: >60 mL/min (ref >60)
GFR calc non Af Amer: 60 mL/min — ABNORMAL LOW (ref >60)
Glucose, Bld: 109 mg/dL — ABNORMAL HIGH (ref 70–99)
Potassium: 4.1 mmol/L (ref 3.5–5.1)
Sodium: 135 mmol/L (ref 135–145)
Total Bilirubin: 0.6 mg/dL (ref 0.3–1.2)
Total Protein: 7.4 g/dL (ref 6.5–8.1)

## 2020-05-23 LAB — CBC WITH DIFFERENTIAL/PLATELET
Abs Immature Granulocytes: 0.14 10*3/uL — ABNORMAL HIGH (ref 0.00–0.07)
Basophils Absolute: 0.1 10*3/uL (ref 0.0–0.1)
Basophils Relative: 1 %
Eosinophils Absolute: 0.7 10*3/uL — ABNORMAL HIGH (ref 0.0–0.5)
Eosinophils Relative: 5 %
HCT: 29 % — ABNORMAL LOW (ref 36.0–46.0)
Hemoglobin: 9.5 g/dL — ABNORMAL LOW (ref 12.0–15.0)
Immature Granulocytes: 1 %
Lymphocytes Relative: 20 %
Lymphs Abs: 2.5 10*3/uL (ref 0.7–4.0)
MCH: 27.2 pg (ref 26.0–34.0)
MCHC: 32.8 g/dL (ref 30.0–36.0)
MCV: 83.1 fL (ref 80.0–100.0)
Monocytes Absolute: 1.4 10*3/uL — ABNORMAL HIGH (ref 0.1–1.0)
Monocytes Relative: 11 %
Neutro Abs: 7.6 10*3/uL (ref 1.7–7.7)
Neutrophils Relative %: 62 %
Platelets: 743 10*3/uL — ABNORMAL HIGH (ref 150–400)
RBC: 3.49 MIL/uL — ABNORMAL LOW (ref 3.87–5.11)
RDW: 18.4 % — ABNORMAL HIGH (ref 11.5–15.5)
WBC: 12.5 10*3/uL — ABNORMAL HIGH (ref 4.0–10.5)
nRBC: 0.2 % (ref 0.0–0.2)

## 2020-05-23 LAB — SARS CORONAVIRUS 2 BY RT PCR (HOSPITAL ORDER, PERFORMED IN ~~LOC~~ HOSPITAL LAB): SARS Coronavirus 2: NEGATIVE

## 2020-05-23 MED ORDER — ENSURE ENLIVE PO LIQD: 237.0000 mL | Freq: Two times a day (BID) | ORAL | Status: DC

## 2020-05-23 MED ORDER — PIPERACILLIN-TAZOBACTAM 3.375 G IVPB: 3.3750 g | Freq: Three times a day (TID) | INTRAVENOUS | 0 refills | Status: DC

## 2020-05-23 MED ORDER — PIPERACILLIN-TAZOBACTAM IV (FOR PTA / DISCHARGE USE ONLY)
3.3750 g | Freq: Four times a day (QID) | INTRAVENOUS | 0 refills | Status: AC
Start: 2020-05-23 — End: 2020-05-31

## 2020-05-23 MED ORDER — ONDANSETRON 4 MG PO TBDP: 4.0000 mg | ORAL_TABLET | Freq: Four times a day (QID) | ORAL | 0 refills | Status: DC | PRN

## 2020-05-23 MED ORDER — SODIUM CHLORIDE 0.9% FLUSH
5.0000 mL | Freq: Three times a day (TID) | INTRAVENOUS | Status: DC
  Administered 2020-05-23: 5 mL

## 2020-05-23 MED ORDER — IOHEXOL 300 MG/ML  SOLN
100.0000 mL | Freq: Once | INTRAMUSCULAR | Status: AC | PRN
  Administered 2020-05-23: 100 mL via INTRAVENOUS

## 2020-05-23 MED ORDER — OXYCODONE HCL 5 MG PO TABS: 5.0000 mg | ORAL_TABLET | Freq: Four times a day (QID) | ORAL | 0 refills | Status: DC | PRN

## 2020-05-23 NOTE — Progress Notes (Signed)
Nutrition Follow Up Note   DOCUMENTATION CODES:   Obesity unspecified  INTERVENTION:   Ensure Enlive po BID, each supplement provides 350 kcal and 20 grams of protein  Recommend daily MVI   Pt at moderate refeed risk; recommend monitor K, Mg and P labs daily until stable  NUTRITION DIAGNOSIS:   Increased nutrient needs related to post-op healing as evidenced by increased estimated needs.   GOAL:   Patient will meet greater than or equal to 90% of their needs  -not met   MONITOR:   PO intake, Supplement acceptance, Labs, Weight trends, Skin, I & O's  ASSESSMENT:   79 year old female admitted for intra-abdominal fluid collections and blood per rectum s/p open colostomy reversal on 7/22 with creation of diverting loop ileostomy due to anastomotic leak intraoperatively. Past medical history of atrial fibrillation, breast cancer, DM2, GERD, and diverticulitis.  8/11 - pelvic drain placed  Pt with fair appetite and oral intake in hospital. Pt documented to be eating anywhere from sips/bites to 100% of meals. RD will add supplements to help pt meet her estimated needs; per MD note, pt enjoys Ensure. Pt NPO today for CT scan. Drains in place with 41m output. Pt is having stool via ostomy. No new weight since admit; will request weekly weights. Plan is for SNF after discharge.   Medications reviewed and include: tums, D3, lovenox, protonix, psyllium, B12, zinc, zosyn  Labs reviewed: K 4.1 wnl Wbc- 12.5(H), Hgb 9.5(L), Hct 29.0(L)  Diet Order:   Diet Order            Diet NPO time specified  Diet effective now                EDUCATION NEEDS:   No education needs have been identified at this time  Skin:  Skin Assessment: Skin Integrity Issues: Skin Integrity Issues:: Incisions, Other (Comment) Incisions: closed; abdomen (7/22) Other: Drain; right buttocks  Last BM:  8/15- 3028mvia ostomy  Height:   Ht Readings from Last 1 Encounters:  05/13/20 '4\' 10"'$  (1.473 m)     Weight:   Wt Readings from Last 1 Encounters:  05/13/20 86.2 kg    BMI:  Body mass index is 39.71 kg/m.  Estimated Nutritional Needs:   Kcal:  1700-1900kcal/day  Protein:  85-95g/day  Fluid:  1.3-1.6L/day  CaKoleen DistanceS, RD, LDN Please refer to [facility] and/or RD on-call/weekend/after hours pager
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
## 2020-05-23 NOTE — TOC Transition Note (Signed)
Transition of Care [facility]) - CM/SW Discharge Note   Patient Details  Name: Nancy Hickman MRN: [id number] Date of Birth: [birth year]  Transition of Care [facility]) CM/SW Contact:  Candie Chroman, LCSW Phone Number: 05/23/2020, 3:51 PM   Clinical Narrative:  Patient has orders to discharge to [facility] today. RN will call report to [phone]. EMS transport has been set up. Patient is 6th on the list. No further concerns. CSW signing off.   Final next level of care: Skilled Nursing Facility Barriers to Discharge: Barriers Resolved   Patient Goals and CMS Choice     Choice offered to / list presented to : Patient, Adult Children  Discharge Placement PASRR number recieved: 05/19/20            Patient chooses bed at: [facility] [place] Patient to be transferred to facility by: EMS Name of family member notified: Sam Malpass Patient and family notified of of transfer: 05/23/20  Discharge Plan and Services                                     Social Determinants of Health (SDOH) Interventions     Readmission Risk Interventions No flowsheet data found.

## 2020-05-23 NOTE — Progress Notes (Signed)
Physical Therapy Treatment Patient Details Name: Nancy Hickman MRN: [id number] DOB: 1941/02/11 Today's Date: 05/23/2020    History of Present Illness Pt is a 79 y.o. female w/ PMH of afib, anemia, arthritis, breast cancer, DM, hiatal hernia, MI, and sepsis. Per MD impression, pt currently presents s/p open colostomy reversal on 7/22 with creation of diverting loop ileostomy due to anastomotic leak intraoperatively and 8/4 began to have LBP, bloody mucus per rectum, and urinary incontinence.    PT Comments    Ready for session.  OOB and is able to progress gait in hallway with RW and min guard 120'.  To commode prior to gait to void and pt voices continued concern over incontinence issues post op.  She is able to void and no leaking during gait but is focused on having trouble without pads. She remains in recliner after session.  Pt progressing well with mobility skills and remains motivated to increase independence and return home.   Follow Up Recommendations  SNF     Equipment Recommendations  Rolling walker with 5" wheels    Recommendations for Other Services       Precautions / Restrictions Precautions Precautions: Fall Restrictions Other Position/Activity Restrictions: drain (L buttocks), ileostomy (RLQ)    Mobility  Bed Mobility Overal bed mobility: Needs Assistance Bed Mobility: Supine to Sit     Supine to sit: Supervision        Transfers Overall transfer level: Needs assistance Equipment used: Rolling walker (2 wheeled) Transfers: Sit to/from Stand Sit to Stand: Min guard            Ambulation/Gait Ambulation/Gait assistance: Min guard Gait Distance (Feet): 120 Feet Assistive device: Rolling walker (2 wheeled) Gait Pattern/deviations: Step-through pattern;Decreased step length - left;Decreased step length - right Gait velocity: decreased   General Gait Details: steady gait in hallway, limited by general fatigue.  no buckling noted and good  safety.   Stairs             Wheelchair Mobility    Modified Rankin (Stroke Patients Only)       Balance Overall balance assessment: Needs assistance Sitting-balance support: Feet unsupported Sitting balance-Leahy Scale: Good       Standing balance-Leahy Scale: Fair                              Cognition Arousal/Alertness: Awake/alert Behavior During Therapy: WFL for tasks assessed/performed Overall Cognitive Status: Within Functional Limits for tasks assessed                                 General Comments: A&Ox4      Exercises      General Comments        Pertinent Vitals/Pain Pain Assessment: Faces Faces Pain Scale: Hurts little more Pain Location: drain insertion, L buttocks Pain Descriptors / Indicators: Constant;Aching;Sore Pain Intervention(s): Limited activity within patient's tolerance;Monitored during session;Repositioned    Home Living                      Prior Function            PT Goals (current goals can now be found in the care plan section) Progress towards PT goals: Progressing toward goals    Frequency    Min 2X/week      PT Plan Current plan remains appropriate    Co-evaluation  AM-PAC PT "6 Clicks" Mobility   Outcome Measure  Help needed turning from your back to your side while in a flat bed without using bedrails?: A Little Help needed moving from lying on your back to sitting on the side of a flat bed without using bedrails?: A Little Help needed moving to and from a bed to a chair (including a wheelchair)?: A Little Help needed standing up from a chair using your arms (e.g., wheelchair or bedside chair)?: A Little Help needed to walk in hospital room?: A Little Help needed climbing 3-5 steps with a railing? : A Lot 6 Click Score: 17    End of Session Equipment Utilized During Treatment: Gait belt Activity Tolerance: Patient limited by pain Patient left:  with call bell/phone within reach;in chair (repositioned in sidelying for pain relief) Nurse Communication: Mobility status PT Visit Diagnosis: Muscle weakness (generalized) (M62.81);Difficulty in walking, not elsewhere classified (R26.2);Unsteadiness on feet (R26.81);Dizziness and giddiness (R42)     Time: 2563-8937 PT Time Calculation (min) (ACUTE ONLY): 25 min  Charges:  $Gait Training: 23-37 mins                    [name], PTA 05/23/20, 10:59 AM

## 2020-05-23 NOTE — Discharge Summary (Addendum)
[facility] SURGICAL DISCHARGE SUMMARY  Patient ID: Nancy Hickman MRN: [id number] DOB/AGE: 10-27-1940 79 y.o.  Admit date: 05/13/2020 Discharge date: 05/23/2020  Discharge Diagnoses Patient Active Problem List   Diagnosis Date Noted  . Pelvic hematoma in female   . Intraabdominal fluid collection 05/13/2020  . Colostomy in place [facility]) 04/28/2020  . Parastomal hernia without obstruction or gangrene   . Incisional hernia, without obstruction or gangrene   . Hiatal hernia with GERD   . Stricture and stenosis of esophagus   . Special screening for malignant neoplasms, colon   . Benign neoplasm of cecum     Consultants Interventional Radiology Infectious Disease  OB/GYN  Procedures 05/14/2020: US Aspiration  05/18/2020:  CT Guided percutaneous catheter placement    HPI: Nancy Hickman is a 79 y.o. female s/p open colostomy reversal on 7/22 with creation of diverting loop ileostomy due to anastomotic leak intraoperatively.  She was seen in the office on 8/2 at which time her JP drain was removed, staples were removed and steri strips were placed, and her ostomy bridge was removed.  She was doing well until 8/4 when she started having low back pain, with an episode of bloody mucus per rectum, associated with urinary incontinence.  She also reports having serous drainage from the midline incision.  Denies any fevers, chills, chest pain, shortness of breath.  The ileostomy has been working well with stool and gas.    In the ED, her workup showed a WBC of 13, Hgb stable at 8.8 (from 8.3 prior to discharge), Cr of 0.81.  CT scan of abdomen and pelvis with oral and IV contrast was also obtained, which shows two enlarging fluid collections, one in the pelvis next to the anastomosis, and one in the right lower quadrant.  Both had been present on CT scan from 7/28, but were much smaller.  There is no gas in the collections, and there is no hard evidence of anastomotic leak  on CT scan.  There is some stranding around the midline and LLQ incisions, but no abscess there.  Hospital Course: She was admitted to general surgery service and interventional radiology was consulted. She had CT guided aspiration of intraabdominal fluid which appeared consistent with hematoma. However, this ultimately grew out E coli with multiple resistances and she was changed to Zosyn after Cx returned. She was also seen by OB/GYN on 08/07 secondary for concern for colo-uterine fistula on IR procedure CT but there was no evidence of this on somewhat limited examination secondary to cervical stenosis. Pelvic and transvaginal ultrasound also did not demonstrate a fistula.  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 CT Scan was repeated and showed concern for enlarged pelvic fluid collection now also containing gas and on 08/11 she had a drain placed into this. ID was consulted to aid in determination of ABx regimen for home. A final CT was repeated on day of discharge (05/23/2020) which showed improvement in her fluid collections. The remainder of patient's hospital course was essentially unremarkable, and discharge planning was initiated accordingly with patient safely able to be discharged to SNF with appropriate discharge instructions, antibiotics (IV Zosyn until 08/24), pain control, and outpatient follow-up after all of her questions were answered to her  expressed satisfaction.   Discharge Condition: Good   Physical Examination:  Constitutional: alert, cooperative and no distress  Respiratory: breathing non-labored at rest  Cardiovascular: regular rate and sinus rhythm  Gastrointestinal:Soft, non-distended, non-tender.Ileostomy in  RLQ ispink, patent, with liquid stool in bag.Drain in the right buttock, output appears to be consistent with old blood and serous fluid Integumentary:Midline incision has 1.5 cm opening at the level of the  umbilicus, with some minimaldrainage, no purulence or surrounding erythema    Allergies as of 05/23/2020      Reactions   Doxycycline Rash, Other (See Comments)   "Water blisters"   Lorcet Plus [hydrocodone-acetaminophen] Nausea And Vomiting   Penicillins Nausea And Vomiting, Other (See Comments)   Vomiting to the point of dehydration.   Chlorhexidine    Erythromycin Base Nausea And Vomiting   Neomycin Nausea And Vomiting   Statins Other (See Comments)   Muscle pain.      Medication List    TAKE these medications   aspirin EC 81 MG tablet Take 81 mg by mouth at bedtime. Swallow whole.   calcium carbonate 1250 (500 Ca) MG tablet Commonly known as: OS-CAL - dosed in mg of elemental calcium Take 1 tablet by mouth daily.   diclofenac 75 MG EC tablet Commonly known as: VOLTAREN Take 75 mg by mouth 2 (two) times daily as needed (pain.).   fluticasone 50 MCG/ACT nasal spray Commonly known as: FLONASE Place 1-2 sprays into both nostrils daily as needed for allergies.   gabapentin 300 MG capsule Commonly known as: NEURONTIN Take 1 capsule (300 mg total) by mouth 3 (three) times daily.   ibuprofen 800 MG tablet Commonly known as: ADVIL Take 1 tablet (800 mg total) by mouth every 8 (eight) hours as needed.   loperamide 2 MG capsule Commonly known as: IMODIUM Take 2 mg by mouth 3 (three) times daily as needed for diarrhea or loose stools.   meclizine 25 MG tablet Commonly known as: ANTIVERT Take 25 mg by mouth 3 (three) times daily as needed for nausea.   metoprolol succinate 25 MG 24 hr tablet Commonly known as: TOPROL-XL Take 25 mg by mouth every evening.   omeprazole 20 MG capsule Commonly known as: PRILOSEC Take 20 mg by mouth at bedtime.   ondansetron 4 MG disintegrating tablet Commonly known as: ZOFRAN-ODT Take 1 tablet (4 mg total) by mouth every 6 (six) hours as needed for nausea.   oxyCODONE 5 MG immediate release tablet Commonly known as: Oxy  IR/ROXICODONE Take 1 tablet (5 mg total) by mouth every 6 (six) hours as needed for severe pain or breakthrough pain.   piperacillin-tazobactam  IVPB Commonly known as: ZOSYN Inject 3.375 g into the vein every 6 (six) hours for 8 days. Infuse each dose over 30 minutes Indication:  Infected pelvic hematoma Last Day of Therapy: 05/31/2020 Labs - Once weekly:  CBC/D and BMP Tolerated zosyn in hospital   Vitamin B-12 5000 MCG Tbdp Take 5,000 mcg by mouth at bedtime.   Vitamin D3 50 MCG (2000 UT) Tabs Take 4,000 Units by mouth at bedtime.   Zinc 50 MG Tabs Take 50 mg by mouth at bedtime.            Home Infusion Instuctions  (From admission, onward)         Start     Ordered   05/23/20 0000  Home infusion instructions       Question:  Instructions  Answer:  Flushing of vascular access device: 0.9% NaCl pre/post medication administration and prn patency; Heparin 100 u/ml, 9ml for implanted ports and Heparin 10u/ml, 44ml for all other central venous catheters.   05/23/20 1630  Discharge Care Instructions  (From admission, onward)         Start     Ordered   05/23/20 0000  Discharge wound care:       Comments: Pack midline wound with dry gauze, cover with guaze and secure with tape. This should be done daily to BID as needed   05/23/20 1510            Contact information for follow-up providers    Shanele Nissan, Jacqulyn Bath, MD. Schedule an appointment as soon as possible for a visit in 1 week.   Specialty: General Surgery Why: 1-2 week follow up, intra-abdominal fluid collection, has drain, will be on IV Abx outpatient.  Contact information: [address] [place] [phone]            Contact information for after-discharge care    Destination    [facility] Preferred SNF .   Service: Skilled Nursing Contact information: [address] [place] [phone]                    Time spent on discharge management including discussion of hospital course, clinical condition, outpatient instructions, prescriptions, and follow up with the patient and members of the medical team: >30 minutes  -- Edison Simon , PA-C [facility]  05/23/2020, 4:31 PM [phone] M-F: 7am - 4pm

## 2020-05-23 NOTE — Progress Notes (Signed)
Nancy Hickman A and O x4. VSS. Pt tolerating diet well. No complaints of nausea or vomiting. IV removed intact, prescriptions given. Pt discharge sent with her. Patient discharged via EMS  Allergies as of 05/23/2020      Reactions   Doxycycline Rash, Other (See Comments)   "Water blisters"   Lorcet Plus [hydrocodone-acetaminophen] Nausea And Vomiting   Penicillins Nausea And Vomiting, Other (See Comments)   Vomiting to the point of dehydration.   Chlorhexidine    Erythromycin Base Nausea And Vomiting   Neomycin Nausea And Vomiting   Statins Other (See Comments)   Muscle pain.      Medication List    TAKE these medications   aspirin EC 81 MG tablet Take 81 mg by mouth at bedtime. Swallow whole.   calcium carbonate 1250 (500 Ca) MG tablet Commonly known as: OS-CAL - dosed in mg of elemental calcium Take 1 tablet by mouth daily.   diclofenac 75 MG EC tablet Commonly known as: VOLTAREN Take 75 mg by mouth 2 (two) times daily as needed (pain.).   fluticasone 50 MCG/ACT nasal spray Commonly known as: FLONASE Place 1-2 sprays into both nostrils daily as needed for allergies.   gabapentin 300 MG capsule Commonly known as: NEURONTIN Take 1 capsule (300 mg total) by mouth 3 (three) times daily.   ibuprofen 800 MG tablet Commonly known as: ADVIL Take 1 tablet (800 mg total) by mouth every 8 (eight) hours as needed.   loperamide 2 MG capsule Commonly known as: IMODIUM Take 2 mg by mouth 3 (three) times daily as needed for diarrhea or loose stools.   meclizine 25 MG tablet Commonly known as: ANTIVERT Take 25 mg by mouth 3 (three) times daily as needed for nausea.   metoprolol succinate 25 MG 24 hr tablet Commonly known as: TOPROL-XL Take 25 mg by mouth every evening.   omeprazole 20 MG capsule Commonly known as: PRILOSEC Take 20 mg by mouth at bedtime.   ondansetron 4 MG disintegrating tablet Commonly known as: ZOFRAN-ODT Take 1 tablet (4 mg total) by mouth every 6  (six) hours as needed for nausea.   oxyCODONE 5 MG immediate release tablet Commonly known as: Oxy IR/ROXICODONE Take 1 tablet (5 mg total) by mouth every 6 (six) hours as needed for severe pain or breakthrough pain.   piperacillin-tazobactam  IVPB Commonly known as: ZOSYN Inject 3.375 g into the vein every 6 (six) hours for 8 days. Infuse each dose over 30 minutes Indication:  Infected pelvic hematoma Last Day of Therapy: 05/31/2020 Labs - Once weekly:  CBC/D and BMP Tolerated zosyn in hospital   Vitamin B-12 5000 MCG Tbdp Take 5,000 mcg by mouth at bedtime.   Vitamin D3 50 MCG (2000 UT) Tabs Take 4,000 Units by mouth at bedtime.   Zinc 50 MG Tabs Take 50 mg by mouth at bedtime.            Home Infusion Instuctions  (From admission, onward)         Start     Ordered   05/23/20 0000  Home infusion instructions       Question:  Instructions  Answer:  Flushing of vascular access device: 0.9% NaCl pre/post medication administration and prn patency; Heparin 100 u/ml, 84ml for implanted ports and Heparin 10u/ml, 13ml for all other central venous catheters.   05/23/20 1630           Discharge Care Instructions  (From admission, onward)  Start     Ordered   05/23/20 0000  Discharge wound care:       Comments: Pack midline wound with dry gauze, cover with guaze and secure with tape. This should be done daily to BID as needed   05/23/20 1510          Vitals:   05/23/20 0402 05/23/20 1159  BP: (!) 130/59 (!) 113/59  Pulse: 88 87  Resp: 16 16  Temp: 98.7 F (37.1 C) 98.6 F (37 C)  SpO2: 91% 94%    Darnelle Catalan

## 2020-05-23 NOTE — Progress Notes (Signed)
This nurse attempted to give report to [facility] three times. Was unable to get in touch with someone.

## 2020-05-23 NOTE — Care Management Important Message (Signed)
Important Message  Patient Details  Name: Nancy Hickman MRN: [id number] Date of Birth: [birth year]   Medicare Important Message Given:  Yes     Loann Quill 05/23/2020, 11:34 AM

## 2020-05-24 ENCOUNTER — Telehealth: Payer: Self-pay | Admitting: *Deleted

## 2020-05-24 ENCOUNTER — Telehealth: Payer: Self-pay | Admitting: Surgery

## 2020-05-24 NOTE — Telephone Encounter (Signed)
Dana from [employer] called and stated that she needs to talk to a nurse is regards the the patients JP Drain- she stated that its not working probably.    Her number is [phone]

## 2020-05-24 NOTE — Telephone Encounter (Signed)
Patient is calling and has some questions about some antibiotics and  Some other issues. Please call patient and advise.

## 2020-05-24 NOTE — Telephone Encounter (Signed)
Spoke with [name] at [employer] and she says when she was doing the dressing change she noticed that the dressing was very dirty and the patient's drain was not working properly. She states it looks like it was cut and then taped back together. The bulb is not staying compressed once emptied. The patient was scheduled for a post op appointment tomorrow but had previously canceled this. I let the nurse know that the patient needed to be seen in order to assess the drain. She will come in tomorrow at 11 am to see Nancy Simon PA-C.

## 2020-05-24 NOTE — Telephone Encounter (Signed)
Called patient with no answer. Left vm to call the office back.

## 2020-05-25 ENCOUNTER — Encounter: Payer: Self-pay | Admitting: Surgery

## 2020-05-25 ENCOUNTER — Ambulatory Visit
Admission: RE | Admit: 2020-05-25 | Discharge: 2020-05-25 | Disposition: A | Discharge status: Home / Self Care | Payer: Medicare Other | Source: Ambulatory Visit | Attending: Surgery | Admitting: Surgery

## 2020-05-25 ENCOUNTER — Other Ambulatory Visit: Payer: Self-pay

## 2020-05-25 ENCOUNTER — Ambulatory Visit (INDEPENDENT_AMBULATORY_CARE_PROVIDER_SITE_OTHER): Payer: Medicare Other | Admitting: Surgery

## 2020-05-25 VITALS — BP 125/78 | HR 96 | Temp 98.3°F | Ht <= 58 in | Wt 175.4 lb

## 2020-05-25 DIAGNOSIS — R188 Other ascites: Secondary | ICD-10-CM | POA: Diagnosis present

## 2020-05-25 DIAGNOSIS — Z09 Encounter for follow-up examination after completed treatment for conditions other than malignant neoplasm: Secondary | ICD-10-CM

## 2020-05-25 DIAGNOSIS — T8149XA Infection following a procedure, other surgical site, initial encounter: Secondary | ICD-10-CM

## 2020-05-25 DIAGNOSIS — K913 Postprocedural intestinal obstruction, unspecified as to partial versus complete: Secondary | ICD-10-CM

## 2020-05-25 MED ORDER — IOHEXOL 300 MG/ML  SOLN
100.0000 mL | Freq: Once | INTRAMUSCULAR | Status: AC | PRN
  Administered 2020-05-25: 100 mL via INTRAVENOUS

## 2020-05-25 NOTE — Patient Instructions (Signed)
Dr.Piscoya placed order for STAT CT abdomen pelvis with contrast at today's visit and after scan patient will go back to nursing facility and await his call as what the next recommended steps will be for the patient.   CT Scan  A CT scan is a kind of X-ray. A CT scan makes pictures of the inside of your body. In this procedure, the pictures will be taken in a large machine that has an opening (CT scanner). What happens before the procedure? Staying hydrated Follow instructions from your doctor about hydration, which may include:  Up to 2 hours before the procedure - you may continue to drink clear liquids. These include water, clear fruit juice, black coffee, and plain tea. Eating and drinking restrictions Follow instructions from your doctor about eating and drinking, which may include:  24 hours before the procedure - stop drinking caffeinated drinks. These include energy drinks, tea, soda, coffee, and hot chocolate.  8 hours before the procedure - stop eating heavy meals or foods. These include meat, fried foods, or fatty foods.  6 hours before the procedure - stop eating light meals or foods. These include toast or cereal.  6 hours before the procedure - stop drinking milk or drinks that have milk in them.  2 hours before the procedure - stop drinking clear liquids. General instructions  Take off any jewelry.  Ask your doctor about changing or stopping your normal medicines. This is important if you take diabetes medicines or blood thinners. What happens during the procedure?  You will lie on a table with your arms above your head.  An IV tube may be put into one of your veins.  Dye may be put into the IV tube. You may feel warm or have a metal taste in your mouth.  The table you will be lying on will move into the CT scanner.  You will be able to see, hear, and talk to the person who is running the machine while you are in it. Follow that person's directions.  The machine  will move around you to take pictures. Do not move.  When the machine is done taking pictures, it will be turned off.  The table will be moved out of the machine.  Your IV tube will be taken out. The procedure may vary among doctors and hospitals. What happens after the procedure?  It is up to you to get your results. Ask when your results will be ready. Summary  A CT scan is a kind of X-ray.  A CT scan makes pictures of the inside of your body.  Follow instructions from your doctor about eating and drinking before the procedure.  You will be able to see, hear, and talk to the person who is running the machine while you are in it. Follow that person's directions. This information is not intended to replace advice given to you by your health care provider. Make sure you discuss any questions you have with your health care provider. Document Revised: 10/11/2016 Document Reviewed: 10/11/2016 Elsevier Patient Education  [place].

## 2020-05-25 NOTE — Progress Notes (Addendum)
Addendum: CT scan abdomen/pelvis was done and I have discussed the images with Dr. Quintella Reichert with Radiology.  The pelvic and RLQ fluid collections are overall stable, and post-op changes in her abdomen are stable as well.  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  On today's visit, the patient denied any lower extremity pain or significant swelling, and there was no gross edema noted.  I have discussed this with Dr. Delana Meyer as well, as as a precaution, I will start her on Eliquis 5 mg BID for DVT treatment.  I have called [facility] and spoken with her nurse and given her a verbal order to start this medication tonight.  Olean Ree, MD   05/25/2020  HPI: Anyra Kaufman is a 79 y.o. female s/p open colostomy reversal with diverting loop ileostomy.  She was hospitalized again between 8/6 and 8/16.  On admission was found to have enlarging RLQ and pelvic fluid collections, thought to be hematomas.  However, she was complaining of severe pelvic pain as well as bloody mucus per rectum.  IR attempted aspiration of the pelvic fluid and was noted to be old blood, however, the fluid cultures grew E coli.  She had a percutaneous drain placed by IR on 8/11 into the pelvic collection, and repeat CT scan on 8/16 showed improved size of both pelvic and RLQ collections.  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  She was discharged to SNF with PICC line for IV Zosyn given the resistances of the E coli.    Today, she presents because her nursing home called that the drain was malfunctioning.  The patient reports that her drain was no longer there and has been having drainage onto a dressing placed.  She also reports that she has not gotten any antibiotics and had felt feverish at the nursing home.  I called [facility] and spoke with Maudie Mercury, the nurse manager, as well as the nurse that manages patient's  wounds/dressings.  Yesterday, the drain was found cut/torn with tape around it, but clearly no longer in continuity.  There was short stump of the drain left visible through the skin, and this was dressed and we were called.  However, she does not know what happened to the drain between discharge from [facility] and Tuesday morning that it was checked by the wound nurse.  With regards to the antibiotic, she was not started on them yesterday because there was a dosage clarification that needed to be done with pharmacy, but the antibiotic is now available and ready to be given as soon as she returns.  There was also apparently no order for the wound dressing for the midline wound.  Vital signs: BP 125/78   Pulse 96   Temp 98.3 F (36.8 C) (Oral)   Ht 4\' 10"  (1.473 m)   Wt 175 lb 6.4 oz (79.6 kg)   LMP  (LMP Unknown)   SpO2 93%   BMI 36.66 kg/m    Physical Exam: Constitutional:  No acute distress, afebrile here. Abdomen:  Soft, non-distended, non-tender to palpation.  Midline 2 cm incision with dressing packing in place.  This was removed revealing trace of purulent fluid which was cleaned.  The wound was then repacked with gauze and dressed with gauze/tape.  The wound bed itself appears healthy and without necrosis.  The rest of the midline incision and the prior ostomy site are healing well.  RLQ loop  ileostomy healthy with stool and gas in bag. Skin:  Left gluteal drain was found transected about 1-2 cm from the skin insertion site.  The retaining suture was cut and the drain was removed.  Dry gauze dressing was applied.  Assessment/Plan: This is a 79 y.o. 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  --The patient currently reports that her pelvic pain has improved and the bloody mucus has dissipated.  However, without the drain, my main concern would be if the fluid collection has  worsened or if more air has been able to get into the cavity.  I discussed with Dr. Earleen Newport with IR and she'll be sent for repeat CT scan today to further evaluate.  If the fluid collection is stable, may not be able to place new drain given the smaller size and the higher location.  We will follow up on CT and have advised the nursing home that we would contact if any changes need to happen or procedures need to be scheduled.  Will addend this note with the CT scan findings. --Discussed with nursing home and her IV Zosyn is now available.  Given the two day delay, we will also extend the dosage for two additional days past the original stop date. --Placed new orders for dressing changes for the midline wound, dressing changes for the prior drain site. --Follow up in 1 week to reassess her progress, or sooner if anything else changes.   Melvyn Neth, [facility]

## 2020-06-03 ENCOUNTER — Encounter: Payer: Self-pay | Admitting: Surgery

## 2020-06-03 ENCOUNTER — Ambulatory Visit (INDEPENDENT_AMBULATORY_CARE_PROVIDER_SITE_OTHER): Payer: Medicare Other | Admitting: Surgery

## 2020-06-03 ENCOUNTER — Other Ambulatory Visit: Payer: Self-pay

## 2020-06-03 VITALS — BP 118/70 | HR 94 | Temp 98.5°F | Resp 12 | Ht <= 58 in | Wt 174.0 lb

## 2020-06-03 DIAGNOSIS — T8149XA Infection following a procedure, other surgical site, initial encounter: Secondary | ICD-10-CM

## 2020-06-03 DIAGNOSIS — K913 Postprocedural intestinal obstruction, unspecified as to partial versus complete: Secondary | ICD-10-CM

## 2020-06-03 DIAGNOSIS — R188 Other ascites: Secondary | ICD-10-CM

## 2020-06-03 DIAGNOSIS — Z09 Encounter for follow-up examination after completed treatment for conditions other than malignant neoplasm: Secondary | ICD-10-CM

## 2020-06-03 NOTE — Patient Instructions (Addendum)
CT abdomen and pelvis scheduled at [facility] on 06/15/20 @ 1:15pm. Nothing to eat/drink 4 hours prior.  Please see your follow up appointment listed below with Dr.Piscoya.  Referral sent to Dr.Schnier- [facility]. Someone from their office will call to schedule an appointment. If you do not hear from anyone within 7-10 days please call our office and let us know.

## 2020-06-03 NOTE — Progress Notes (Signed)
06/03/2020  HPI: Nancy Hickman is a 79 y.o. female s/p open colostomy reversal with diverting loop ileostomy on 7/22.  She had been recently re-admitted on 8/6 with pelvic pain and enlarging pelvic and RLQ fluid collections, thought to be hematomas.  However, on aspiration of the pelvic collection, fluid grew E coli and a drain was subsequently placed.  Gastrograffin enema showed an anastomotic stricture but no leak.  She was discharged on 8/16 to SNF after follow up CT scan showed improvement in the fluid collections.  She was seen in office on 8/18 because of drain malfunction.  It appears that at her SNF, the drain somehow was transected, with a 1-2 cm stump left behind.  Drain was removed in office and repeat CT scan showed stable collections.  However, it also showed bilateral lower extremity DVTs extending to the femoral and external iliac veins visible on CT.  She was started on Eliquis after consulting with Dr. Delana Meyer.  IV Zosyn was continued and it just stopped yesterday on 8/26.  Patient has been doing well with no further pelvic pain and no further bloody mucus.  Denies any fevers, chills, chest pain, shortness of breath.  She was diagnosed with UTI at her SNF two days ago.  Her ostomy is working well but her appliance has been having issues and it was leaking.  Vital signs: BP 118/70   Pulse 94   Temp 98.5 F (36.9 C) (Oral)   Resp 12   Ht 4\' 10"  (1.473 m)   Wt 174 lb (78.9 kg)   LMP  (LMP Unknown)   SpO2 94%   BMI 36.37 kg/m    Physical Exam: Constitutional: No acute distress Abdomen:  Soft, non-distended, non-tender to palpation.  Midline wound continues to heal well, with healthy granulation tissue and no purulence.  Packed with moist to dry dressing.  The ostomy appliance was completely changed and after removing it, the surrounding skin has excoriation from stool contact.  New appliance was placed, with better approximation of the appliance to the stoma itself. Skin:  Site  of the prior percutaneous drain has fully healed. Extremities:  PICC line in place in LUE.  No significant peripheral edema.  Assessment/Plan: This is a 79 y.o. female s/p colostomy reversal with loop ileostomy.  Complicated by pelvic and RLQ fluid collections, anastomotic stricture, newly diagnosed bilateral lower extremity DVTs.  --At this point the patient is doing better and there has been no worsening over the past week without the drain being in place.  She just finished her antibiotic course.  Denies any further pelvic pain. --As per ID recs, she has completed the IV course of antibiotics.  I think it's appropriate to D/C PICC line.  Will hold off on further antibiotics as patient currently is doing well. --Continue midline moist/wet to dry dressing changes.  Once at home, can do once daily. --Continue ostomy maintenance. --Continue Eliquis.  Will send referral to Dr. Delana Meyer for outpatient follow up to determine duration of anticoagulation. --May d/c from SNF when she's ready. --Follow up in approximately two weeks with repeat CT scan to evaluate pelvic collections and check on her progress.   Melvyn Neth, [facility]

## 2020-06-14 ENCOUNTER — Telehealth: Payer: Self-pay | Admitting: Surgery

## 2020-06-14 DIAGNOSIS — R112 Nausea with vomiting, unspecified: Secondary | ICD-10-CM

## 2020-06-14 NOTE — Telephone Encounter (Signed)
Patient calls and said she will not be able to do the CT scan for tomorrow as she is not able to drink the oral contrast.  It makes her sick, she is not able to eat or drink anything, states she can't keep anything down.  Can she do just the IV contrast only and not to the oral?  Please call the patient.  Thank you.

## 2020-06-14 NOTE — Telephone Encounter (Signed)
Per Dr.Piscoya patient may have CT scan with just IV contrast due to the oral contrast make her sick to her stomach. Patient complained of having severe nausea and the shakes. Per Dr.Piscoya, patient will have CBC and CMP tomorrow before or after the scheduled CT scan. Patient verbalized understanding and has no further questions.

## 2020-06-15 ENCOUNTER — Encounter: Payer: Self-pay | Admitting: Surgery

## 2020-06-15 ENCOUNTER — Other Ambulatory Visit
Admission: RE | Admit: 2020-06-15 | Discharge: 2020-06-15 | Disposition: A | Discharge status: Home / Self Care | Payer: Medicare Other | Source: Home / Self Care | Attending: Surgery | Admitting: Surgery

## 2020-06-15 ENCOUNTER — Other Ambulatory Visit: Payer: Self-pay

## 2020-06-15 ENCOUNTER — Ambulatory Visit
Admission: RE | Admit: 2020-06-15 | Discharge: 2020-06-15 | Disposition: A | Discharge status: Home / Self Care | Payer: Medicare Other | Source: Ambulatory Visit | Attending: Surgery | Admitting: Surgery

## 2020-06-15 DIAGNOSIS — R112 Nausea with vomiting, unspecified: Secondary | ICD-10-CM | POA: Insufficient documentation

## 2020-06-15 DIAGNOSIS — R188 Other ascites: Secondary | ICD-10-CM | POA: Diagnosis not present

## 2020-06-15 LAB — COMPREHENSIVE METABOLIC PANEL
ALT: 28 U/L (ref 0–44)
AST: 45 U/L — ABNORMAL HIGH (ref 15–41)
Albumin: 3.7 g/dL (ref 3.5–5.0)
Alkaline Phosphatase: 135 U/L — ABNORMAL HIGH (ref 38–126)
Anion gap: 12 (ref 5–15)
BUN: 10 mg/dL (ref 8–23)
CO2: 18 mmol/L — ABNORMAL LOW (ref 22–32)
Calcium: 9.7 mg/dL (ref 8.9–10.3)
Chloride: 103 mmol/L (ref 98–111)
Creatinine, Ser: 1.02 mg/dL — ABNORMAL HIGH (ref 0.44–1.00)
GFR calc Af Amer: >60 mL/min (ref >60)
GFR calc non Af Amer: 53 mL/min — ABNORMAL LOW (ref >60)
Glucose, Bld: 161 mg/dL — ABNORMAL HIGH (ref 70–99)
Potassium: 4 mmol/L (ref 3.5–5.1)
Sodium: 133 mmol/L — ABNORMAL LOW (ref 135–145)
Total Bilirubin: 0.7 mg/dL (ref 0.3–1.2)
Total Protein: 8.7 g/dL — ABNORMAL HIGH (ref 6.5–8.1)

## 2020-06-15 LAB — CBC WITH DIFFERENTIAL/PLATELET
Abs Immature Granulocytes: 0.03 10*3/uL (ref 0.00–0.07)
Basophils Absolute: 0.1 10*3/uL (ref 0.0–0.1)
Basophils Relative: 1 %
Eosinophils Absolute: 0.2 10*3/uL (ref 0.0–0.5)
Eosinophils Relative: 2 %
HCT: 30.8 % — ABNORMAL LOW (ref 36.0–46.0)
Hemoglobin: 9.8 g/dL — ABNORMAL LOW (ref 12.0–15.0)
Immature Granulocytes: 0 %
Lymphocytes Relative: 29 %
Lymphs Abs: 2.3 10*3/uL (ref 0.7–4.0)
MCH: 25.7 pg — ABNORMAL LOW (ref 26.0–34.0)
MCHC: 31.8 g/dL (ref 30.0–36.0)
MCV: 80.8 fL (ref 80.0–100.0)
Monocytes Absolute: 1 10*3/uL (ref 0.1–1.0)
Monocytes Relative: 12 %
Neutro Abs: 4.3 10*3/uL (ref 1.7–7.7)
Neutrophils Relative %: 56 %
Platelets: 610 10*3/uL — ABNORMAL HIGH (ref 150–400)
RBC: 3.81 MIL/uL — ABNORMAL LOW (ref 3.87–5.11)
RDW: 17 % — ABNORMAL HIGH (ref 11.5–15.5)
WBC: 7.8 10*3/uL (ref 4.0–10.5)
nRBC: 0.4 % — ABNORMAL HIGH (ref 0.0–0.2)

## 2020-06-15 MED ORDER — IOHEXOL 300 MG/ML  SOLN
100.0000 mL | Freq: Once | INTRAMUSCULAR | Status: AC | PRN
  Administered 2020-06-15: 100 mL via INTRAVENOUS

## 2020-06-16 ENCOUNTER — Telehealth: Payer: Self-pay

## 2020-06-16 NOTE — Telephone Encounter (Signed)
Received call from [facility] from [facility] CT abdomin and pelvis- new rim enhancing 3.6 cm collection in the stoma consistent  with abscess and there are bilateral lower extremities DVT's.   Spoke with Dr.Piscoya was instructed to add patient to schedule on 06/17/20@ 9:15.   Spoke with patient and confirmed appointment 06/17/20 @ 9:15 am.   Patient would like a call from [facility]- notified Dr.Piscoya to call patient today.

## 2020-06-17 ENCOUNTER — Encounter: Payer: Self-pay | Admitting: Surgery

## 2020-06-17 ENCOUNTER — Other Ambulatory Visit: Payer: Self-pay

## 2020-06-17 ENCOUNTER — Ambulatory Visit (INDEPENDENT_AMBULATORY_CARE_PROVIDER_SITE_OTHER): Payer: Medicare Other | Admitting: Surgery

## 2020-06-17 VITALS — BP 130/80 | HR 94 | Temp 98.7°F | Resp 12 | Ht <= 58 in | Wt 167.0 lb

## 2020-06-17 DIAGNOSIS — R188 Other ascites: Secondary | ICD-10-CM

## 2020-06-17 DIAGNOSIS — T8149XA Infection following a procedure, other surgical site, initial encounter: Secondary | ICD-10-CM

## 2020-06-17 DIAGNOSIS — Z09 Encounter for follow-up examination after completed treatment for conditions other than malignant neoplasm: Secondary | ICD-10-CM

## 2020-06-17 NOTE — Progress Notes (Signed)
06/17/2020  HPI: Severa Jeremiah is a 79 y.o. female s/p colostomy reversal with colorectal anastomosis and diverting loop ileostomy.  Patient has had some post-operative complications of intra-abdominal and pelvic fluid collections (s/p percutaneous drainage of pelvic collection), anastomotic stricture (noted on contrast enema), bilateral femoral DVTs (currently on Eliquis), and midline wound infection.  She is now back at home from a stay in rehab.  She's done with IV antibiotics.  She had a CT scan on 06/15/20 which showed much improvement in the fluid collections.  The pelvic collection is almost resolved, and the right lower quadrant collection is much smaller.  Her midline incision is almost healed.  Her DVTs are still present.  There is concern for a small 3 cm subcutaneous abscess near her stoma.  I have personally viewed her images and agree with the findings.  The patient reports that recently she's been having severe itching, particularly in her back, as well as shaking.  She stopped taking the oxycodone and these symptoms have resolved.  She's on Eliquis still and has a follow up with Dr. Delana Meyer on 9/20.  She's still having a lot of nausea issues, and has a known history of a large hiatal hernia, with most of her stomach in her chest.  Denies any more pelvic pain issues, and has mucus per rectum sometimes.  Vital signs: BP 130/80   Pulse 94   Temp 98.7 F (37.1 C)   Resp 12   Ht 4\' 10"  (1.473 m)   Wt 167 lb (75.8 kg)   LMP  (LMP Unknown)   SpO2 95%   BMI 34.90 kg/m    Physical Exam: Constitutional:  No acute distress Abdomen:  Soft, non-distended, non-tender to palpation.  RLQ loop ileostomy pink, patent, with liquid stool in bag.  No erythema in surrounding skin or tenderness to palpation in that area.  Midline incision is healing well, and the open area from her wound infection is almost healed, with a wound opening of about 1.5 cm diameter and 3 mm depth.  Dry gauze applied to the  wound.  Prior LLQ colostomy site is healed.  Assessment/Plan: This is a 79 y.o. female s/p colostomy reversal with diverting loop ileostomy.  --Discussed CT scan findings with the patient.  I'm reassured with the fact that her fluid collections are much smaller and resolving.  Her pelvic pain is resolved as well.  No evidence of obstruction or distention to cause her nausea.  I suspect the nausea is most likely from her large hiatal hernia.  The small abscess that was noted on CT scan around the ostomy is not causing any pain or erythema, so discussed with her that we would leave that alone for now unless it gets worse. --Patient can now do dry gauze dressing daily over the midline wound.  No further need for packing. --Continue ostomy care. --Follow up with Dr. Delana Meyer as scheduled. --Follow up in two months to reassess her progress.  At that point, we would discuss repeating contrast enema to evaluate the anastomotic stricture and discuss further surgical plans with her ostomy.   Melvyn Neth, [facility]

## 2020-06-17 NOTE — Patient Instructions (Addendum)
Please call our office if you have questions or concerns. Please see your follow up appointment listed below.

## 2020-06-27 ENCOUNTER — Encounter (INDEPENDENT_AMBULATORY_CARE_PROVIDER_SITE_OTHER): Payer: Medicare Other | Admitting: Vascular Surgery

## 2020-08-01 ENCOUNTER — Ambulatory Visit (INDEPENDENT_AMBULATORY_CARE_PROVIDER_SITE_OTHER): Payer: Medicare Other | Admitting: Vascular Surgery

## 2020-08-01 ENCOUNTER — Encounter (INDEPENDENT_AMBULATORY_CARE_PROVIDER_SITE_OTHER): Payer: Self-pay | Admitting: Vascular Surgery

## 2020-08-01 ENCOUNTER — Other Ambulatory Visit: Payer: Self-pay

## 2020-08-01 DIAGNOSIS — I82423 Acute embolism and thrombosis of iliac vein, bilateral: Secondary | ICD-10-CM

## 2020-08-01 DIAGNOSIS — I82409 Acute embolism and thrombosis of unspecified deep veins of unspecified lower extremity: Secondary | ICD-10-CM | POA: Insufficient documentation
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
## 2020-08-17 ENCOUNTER — Ambulatory Visit (INDEPENDENT_AMBULATORY_CARE_PROVIDER_SITE_OTHER): Payer: Medicare Other | Admitting: Surgery

## 2020-08-17 ENCOUNTER — Other Ambulatory Visit: Payer: Self-pay

## 2020-08-17 ENCOUNTER — Encounter: Payer: Self-pay | Admitting: Surgery

## 2020-08-17 VITALS — BP 126/66 | HR 75 | Temp 98.5°F | Ht <= 58 in | Wt 158.8 lb

## 2020-08-17 DIAGNOSIS — R188 Other ascites: Secondary | ICD-10-CM

## 2020-08-17 DIAGNOSIS — K913 Postprocedural intestinal obstruction, unspecified as to partial versus complete: Secondary | ICD-10-CM | POA: Diagnosis not present
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
## 2020-08-17 NOTE — Patient Instructions (Addendum)
Dr.Piscoya suggest patient contact the [place] to see about the 1 piece Colostomy equipment. He also suggested patient to try the Colostomy Belt that attaches to the colostomy bag. Dr.Piscoya discussed with patient and family to continue with Protein Shake or meals with a high source of protein.  Patient advise to increase Fiber intake. Patient may try 2 servings of Benefiber or Metamucil, if patient notices no changes, Dr.Piscoya recommends patient to try over the counter Imodium.  Colostomy Home Guide, Adult  Colostomy surgery is done to create an opening in the front of the abdomen for stool (feces) to leave the body through an ostomy (stoma). Part of the large intestine is attached to the stoma. A bag, also called a pouch, is fitted over the stoma. Stool and gas will collect in the bag. After surgery, you will need to empty and change your colostomy bag as needed. You will also need to care for your stoma. How to care for the stoma Your stoma should look pink, red, and moist, like the inside of your cheek. Soon after surgery, the stoma may be swollen, but this swelling will go away within 6 weeks. To care for the stoma:  Keep the skin around the stoma clean and dry.  Use a clean, soft washcloth to gently wash the stoma and the skin around it. Clean using a circular motion, and wipe away from the stoma opening, not toward it. ? Use warm water and only use cleansers recommended by your health care provider. ? Rinse the stoma area with plain water. ? Dry the area around the stoma well.  Use stoma powder or ointment on your skin only as told by your health care provider. Do not use any other powders, gels, wipes, or creams on the skin around the stoma.  Check the stoma area every day for signs of infection. Check for: ? New or worsening redness, swelling, or pain. ? New or increased fluid or blood. ? Pus or warmth.  Measure the stoma opening regularly and record the size.  Watch for changes. (It is normal for the stoma to get smaller as swelling goes away.) Share this information with your health care provider. How to empty the colostomy bag  Empty your bag at bedtime and whenever it is one-third to one-half full. Do not let the bag get more than half-full with stool or gas. The bag could leak if it gets too full. Some colostomy bags have a built-in gas release valve that releases gas often throughout the day. Follow these basic steps: 1. Wash your hands with soap and water. 2. Sit far back on the toilet seat. 3. Put several pieces of toilet paper into the toilet water. This will prevent splashing as you empty stool into the toilet. 4. Remove the clip or the hook-and-loop fastener from the tail end of the bag. 5. Unroll the tail, then empty the stool into the toilet. 6. Clean the tail with toilet paper or a moist towelette. 7. Reroll the tail, and close it with the clip or the hook-and-loop fastener. 8. Wash your hands again. How to change the colostomy bag Change your bag every 3-4 days or as often as told by your health care provider. Also change the bag if it is leaking or separating from the skin, or if your skin around the stoma looks or feels irritated. Irritated skin may be a sign that the bag is leaking. Always have colostomy supplies with you, and follow these basic steps:  1. Wash your hands with soap and water. Have paper towels or tissues nearby to clean any discharge. 2. Remove the old bag and skin barrier. Use your fingers or a warm cloth to gently push the skin away from the barrier. 3. Clean the stoma area with water or with mild soap and water, as directed. Use water to rinse away any soap. 4. Dry the skin. You may use the cool setting on a hair dryer to do this. 5. Use a tracing pattern (template) to cut the skin barrier to the size needed. 6. If you are using a two-piece bag, attach the bag and the skin barrier to each other. Add the barrier  ring, if you use one. 7. If directed, apply stoma powder or skin barrier gel to the skin. 8. Warm the skin barrier with your hands, or blow with a hair dryer for 5-10 seconds. 9. Remove the paper from the adhesive strip of the skin barrier. 10. Press the adhesive strip onto the skin around the stoma. 11. Gently rub the skin barrier onto the skin. This creates heat that helps the barrier to stick. 12. Apply stoma tape to the edges of the skin barrier, if desired. 78. Wash your hands again. General recommendations  Avoid wearing tight clothes or having anything press directly on your stoma or bag. Change your clothing whenever it is soiled or damp.  You may shower or bathe with the bag on or off. Do not use harsh or oily soaps or lotions. Dry the skin and bag after bathing.  Store all supplies in a cool, dry place. Do not leave supplies in extreme heat because some parts can melt or not stick as well.  Whenever you leave home, take extra clothing and an extra skin barrier and bag with you.  If your bag gets wet, you can dry it with a hair dryer on the cool setting.  To prevent odor, you may put drops of ostomy deodorizer in the bag.  If recommended by your health care provider, put ostomy lubricant inside the bag. This helps stool to slide out of the bag more easily and completely. Contact a health care provider if:  You have new or worsening redness, swelling, or pain around your stoma.  You have new or increased fluid or blood coming from your stoma.  Your stoma feels warm to the touch.  You have pus coming from your stoma.  Your stoma extends in or out farther than normal.  You need to change your bag every day.  You have a fever. Get help right away if:  Your stool is bloody.  You have nausea or you vomit.  You have trouble breathing. Summary  Measure your stoma opening regularly and record the size. Watch for changes.  Empty your bag at bedtime and whenever it is  one-third to one-half full. Do not let the bag get more than half-full with stool or gas.  Change your bag every 3-4 days or as often as told by your health care provider.  Whenever you leave home, take extra clothing and an extra skin barrier and bag with you. This information is not intended to replace advice given to you by your health care provider. Make sure you discuss any questions you have with your health care provider. Document Revised: 01/14/2019 Document Reviewed: 03/20/2017 Elsevier Patient Education  [place].   High-Protein and High-Calorie Diet Eating high-protein and high-calorie foods can help you to gain weight, heal after an  injury, and recover after an illness or surgery. The specific amount of daily protein and calories you need depends on:  Your body weight.  The reason this diet is recommended for you. What is my plan? Generally, a high-protein, high-calorie diet involves:  Eating 250-500 extra calories each day.  Making sure that you get enough of your daily calories from protein. Ask your health care provider how many of your calories should come from protein. Talk with a health care provider, such as a diet and nutrition specialist (dietitian), about how much protein and how many calories you need each day. Follow the diet as directed by your health care provider. What are tips for following this plan?  Preparing meals  Add whole milk, half-and-half, or heavy cream to cereal, pudding, soup, or hot cocoa.  Add whole milk to instant breakfast drinks.  Add peanut butter to oatmeal or smoothies.  Add powdered milk to baked goods, smoothies, or milkshakes.  Add powdered milk, cream, or butter to mashed potatoes.  Add cheese to cooked vegetables.  Make whole-milk yogurt parfaits. Top them with granola, fruit, or nuts.  Add cottage cheese to your fruit.  Add avocado, cheese, or both to sandwiches or salads.  Add meat, poultry, or seafood to  rice, pasta, casseroles, salads, and soups.  Use mayonnaise when making egg salad, chicken salad, or tuna salad.  Use peanut butter as a dip for vegetables or as a topping for pretzels, celery, or crackers.  Add beans to casseroles, dips, and spreads.  Add pureed beans to sauces and soups.  Replace calorie-free drinks with calorie-containing drinks, such as milk and fruit juice.  Replace water with milk or heavy cream when making foods such as oatmeal, pudding, or cocoa. General instructions  Ask your health care provider if you should take a nutritional supplement.  Try to eat six small meals each day instead of three large meals.  Eat a balanced diet. In each meal, include one food that is high in protein.  Keep nutritious snacks available, such as nuts, trail mixes, dried fruit, and yogurt.  If you have kidney disease or diabetes, talk with your health care provider about how much protein is safe for you. Too much protein may put extra stress on your kidneys.  Drink your calories. Choose high-calorie drinks and have them after your meals. What high-protein foods should I eat?  Vegetables Soybeans. Peas. Grains Quinoa. Bulgur wheat. Meats and other proteins Beef, pork, and poultry. Fish and seafood. Eggs. Tofu. Textured vegetable protein (TVP). Peanut butter. Nuts and seeds. Dried beans. Protein powders. Dairy Whole milk. Whole-milk yogurt. Powdered milk. Cheese. [employer]. Eggnog. Beverages High-protein supplement drinks. Soy milk. Other foods Protein bars. The items listed above may not be a complete list of high-protein foods and beverages. Contact a dietitian for more options. What high-calorie foods should I eat? Fruits Dried fruit. Fruit leather. Canned fruit in syrup. Fruit juice. Avocado. Vegetables Vegetables cooked in oil or butter. Fried potatoes. Grains Pasta. Quick breads. Muffins. Pancakes. Ready-to-eat cereal. Meats and other proteins Peanut  butter. Nuts and seeds. Dairy Heavy cream. Whipped cream. Cream cheese. Sour cream. Ice cream. Custard. Pudding. Beverages Meal-replacement beverages. Nutrition shakes. Fruit juice. Sugar-sweetened soft drinks. Seasonings and condiments Salad dressing. Mayonnaise. Alfredo sauce. Fruit preserves or jelly. Honey. Syrup. Sweets and desserts Cake. Cookies. Pie. Pastries. Candy bars. Chocolate. Fats and oils Butter or margarine. Oil. Gravy. Other foods Meal-replacement bars. The items listed above may not be a complete list of  high-calorie foods and beverages. Contact a dietitian for more options. Summary  A high-protein, high-calorie diet can help you gain weight or heal faster after an injury, illness, or surgery.  To increase your protein and calories, add ingredients such as whole milk, peanut butter, cheese, beans, meat, or seafood to meal items.  To get enough extra calories each day, include high-calorie foods and beverages at each meal.  Adding a high-calorie drink or shake can be an easy way to help you get enough calories each day. Talk with your healthcare provider or dietitian about the best options for you. This information is not intended to replace advice given to you by your health care provider. Make sure you discuss any questions you have with your health care provider. Document Revised: 09/06/2017 Document Reviewed: 08/06/2017 Elsevier Patient Education  2020 [employer].

## 2020-10-21 ENCOUNTER — Encounter: Payer: Self-pay | Admitting: Emergency Medicine

## 2020-10-21 ENCOUNTER — Other Ambulatory Visit: Payer: Self-pay

## 2020-10-21 ENCOUNTER — Emergency Department
Admission: EM | Admit: 2020-10-21 | Discharge: 2020-10-21 | Disposition: A | Discharge status: Home / Self Care | Payer: Medicare Other | Attending: Emergency Medicine | Admitting: Emergency Medicine

## 2020-10-21 DIAGNOSIS — Z5321 Procedure and treatment not carried out due to patient leaving prior to being seen by health care provider: Secondary | ICD-10-CM | POA: Insufficient documentation

## 2020-10-21 DIAGNOSIS — R739 Hyperglycemia, unspecified: Secondary | ICD-10-CM | POA: Insufficient documentation

## 2020-10-21 LAB — URINALYSIS, COMPLETE (UACMP) WITH MICROSCOPIC
Bilirubin Urine: NEGATIVE
Glucose, UA: NEGATIVE mg/dL
Hgb urine dipstick: NEGATIVE
Ketones, ur: NEGATIVE mg/dL
Nitrite: NEGATIVE
Protein, ur: 30 mg/dL — AB
Specific Gravity, Urine: 1.024 (ref 1.005–1.030)
pH: 5 (ref 5.0–8.0)

## 2020-10-21 LAB — BASIC METABOLIC PANEL
Anion gap: 11 (ref 5–15)
BUN: 21 mg/dL (ref 8–23)
CO2: 21 mmol/L — ABNORMAL LOW (ref 22–32)
Calcium: 9.4 mg/dL (ref 8.9–10.3)
Chloride: 108 mmol/L (ref 98–111)
Creatinine, Ser: 1.02 mg/dL — ABNORMAL HIGH (ref 0.44–1.00)
GFR, Estimated: 56 mL/min — ABNORMAL LOW (ref >60)
Glucose, Bld: 108 mg/dL — ABNORMAL HIGH (ref 70–99)
Potassium: 4.7 mmol/L (ref 3.5–5.1)
Sodium: 140 mmol/L (ref 135–145)

## 2020-10-21 LAB — CBC
HCT: 29.8 % — ABNORMAL LOW (ref 36.0–46.0)
Hemoglobin: 9.1 g/dL — ABNORMAL LOW (ref 12.0–15.0)
MCH: 24.2 pg — ABNORMAL LOW (ref 26.0–34.0)
MCHC: 30.5 g/dL (ref 30.0–36.0)
MCV: 79.3 fL — ABNORMAL LOW (ref 80.0–100.0)
Platelets: 479 10*3/uL — ABNORMAL HIGH (ref 150–400)
RBC: 3.76 MIL/uL — ABNORMAL LOW (ref 3.87–5.11)
RDW: 23.2 % — ABNORMAL HIGH (ref 11.5–15.5)
WBC: 7.1 10*3/uL (ref 4.0–10.5)
nRBC: 0.4 % — ABNORMAL HIGH (ref 0.0–0.2)

## 2020-10-21 NOTE — ED Triage Notes (Signed)
Pt to ED via POV stating that her blood sugar was 476 this morning. Pt states that she does not take anything for her diabetes. Pt states that she has been having problems regulating her blood sugar since November but it has never been this high. Pt denies any symptoms at this time.

## 2020-10-21 NOTE — ED Notes (Signed)
Pt to front desk stating that she does not want to wait any longer and that she is going home.

## 2020-11-03 ENCOUNTER — Other Ambulatory Visit (INDEPENDENT_AMBULATORY_CARE_PROVIDER_SITE_OTHER): Payer: Self-pay | Admitting: Vascular Surgery

## 2020-11-03 ENCOUNTER — Encounter: Payer: Medicare Other | Admitting: Oncology

## 2020-11-03 ENCOUNTER — Other Ambulatory Visit: Payer: Medicare Other

## 2020-11-03 DIAGNOSIS — I82423 Acute embolism and thrombosis of iliac vein, bilateral: Secondary | ICD-10-CM

## 2020-11-07 ENCOUNTER — Encounter (INDEPENDENT_AMBULATORY_CARE_PROVIDER_SITE_OTHER): Payer: Medicare Other

## 2020-11-07 ENCOUNTER — Ambulatory Visit (INDEPENDENT_AMBULATORY_CARE_PROVIDER_SITE_OTHER): Payer: Medicare Other | Admitting: Vascular Surgery

## 2020-11-10 ENCOUNTER — Encounter: Payer: Self-pay | Admitting: Oncology

## 2020-11-10 ENCOUNTER — Inpatient Hospital Stay: Payer: Medicare Other

## 2020-11-10 ENCOUNTER — Inpatient Hospital Stay: Payer: Medicare Other | Attending: Oncology | Admitting: Oncology

## 2020-11-10 VITALS — BP 118/74 | HR 87 | Temp 96.0°F | Resp 18 | Wt 161.1 lb

## 2020-11-10 DIAGNOSIS — Z7901 Long term (current) use of anticoagulants: Secondary | ICD-10-CM | POA: Diagnosis not present

## 2020-11-10 DIAGNOSIS — Z9081 Acquired absence of spleen: Secondary | ICD-10-CM | POA: Insufficient documentation

## 2020-11-10 DIAGNOSIS — Z933 Colostomy status: Secondary | ICD-10-CM | POA: Diagnosis not present

## 2020-11-10 DIAGNOSIS — D509 Iron deficiency anemia, unspecified: Secondary | ICD-10-CM

## 2020-11-10 DIAGNOSIS — Z9049 Acquired absence of other specified parts of digestive tract: Secondary | ICD-10-CM | POA: Insufficient documentation

## 2020-11-10 DIAGNOSIS — K5732 Diverticulitis of large intestine without perforation or abscess without bleeding: Secondary | ICD-10-CM | POA: Diagnosis not present

## 2020-11-10 DIAGNOSIS — R109 Unspecified abdominal pain: Secondary | ICD-10-CM | POA: Insufficient documentation

## 2020-11-10 DIAGNOSIS — D75839 Thrombocytosis, unspecified: Secondary | ICD-10-CM | POA: Diagnosis present

## 2020-11-10 DIAGNOSIS — N1831 Chronic kidney disease, stage 3a: Secondary | ICD-10-CM | POA: Diagnosis not present

## 2020-11-10 DIAGNOSIS — D631 Anemia in chronic kidney disease: Secondary | ICD-10-CM | POA: Insufficient documentation

## 2020-11-10 LAB — TECHNOLOGIST SMEAR REVIEW: Plt Morphology: ADEQUATE

## 2020-11-10 LAB — COMPREHENSIVE METABOLIC PANEL
ALT: 20 U/L (ref 0–44)
AST: 28 U/L (ref 15–41)
Albumin: 4 g/dL (ref 3.5–5.0)
Alkaline Phosphatase: 94 U/L (ref 38–126)
Anion gap: 9 (ref 5–15)
BUN: 18 mg/dL (ref 8–23)
CO2: 19 mmol/L — ABNORMAL LOW (ref 22–32)
Calcium: 9.3 mg/dL (ref 8.9–10.3)
Chloride: 110 mmol/L (ref 98–111)
Creatinine, Ser: 1.01 mg/dL — ABNORMAL HIGH (ref 0.44–1.00)
GFR, Estimated: 57 mL/min — ABNORMAL LOW (ref >60)
Glucose, Bld: 111 mg/dL — ABNORMAL HIGH (ref 70–99)
Potassium: 4.4 mmol/L (ref 3.5–5.1)
Sodium: 138 mmol/L (ref 135–145)
Total Bilirubin: 0.4 mg/dL (ref 0.3–1.2)
Total Protein: 8.2 g/dL — ABNORMAL HIGH (ref 6.5–8.1)

## 2020-11-10 LAB — RETIC PANEL
Immature Retic Fract: 14.4 % (ref 2.3–15.9)
RBC.: 4.34 MIL/uL (ref 3.87–5.11)
Retic Count, Absolute: 79.9 10*3/uL (ref 19.0–186.0)
Retic Ct Pct: 1.8 % (ref 0.4–3.1)
Reticulocyte Hemoglobin: 30.9 pg (ref >27.9)

## 2020-11-10 LAB — CBC WITH DIFFERENTIAL/PLATELET
Abs Immature Granulocytes: 0.02 10*3/uL (ref 0.00–0.07)
Basophils Absolute: 0.1 10*3/uL (ref 0.0–0.1)
Basophils Relative: 1 %
Eosinophils Absolute: 0.2 10*3/uL (ref 0.0–0.5)
Eosinophils Relative: 3 %
HCT: 37 % (ref 36.0–46.0)
Hemoglobin: 11.6 g/dL — ABNORMAL LOW (ref 12.0–15.0)
Immature Granulocytes: 0 %
Lymphocytes Relative: 33 %
Lymphs Abs: 2.3 10*3/uL (ref 0.7–4.0)
MCH: 26.3 pg (ref 26.0–34.0)
MCHC: 31.4 g/dL (ref 30.0–36.0)
MCV: 83.9 fL (ref 80.0–100.0)
Monocytes Absolute: 0.6 10*3/uL (ref 0.1–1.0)
Monocytes Relative: 9 %
Neutro Abs: 3.8 10*3/uL (ref 1.7–7.7)
Neutrophils Relative %: 54 %
Platelets: 315 10*3/uL (ref 150–400)
RBC: 4.41 MIL/uL (ref 3.87–5.11)
RDW: 25.8 % — ABNORMAL HIGH (ref 11.5–15.5)
WBC: 7 10*3/uL (ref 4.0–10.5)
nRBC: 0 % (ref 0.0–0.2)

## 2020-11-10 LAB — IRON AND TIBC
Iron: 140 ug/dL (ref 28–170)
Saturation Ratios: 34 % — ABNORMAL HIGH (ref 10.4–31.8)
TIBC: 410 ug/dL (ref 250–450)
UIBC: 270 ug/dL

## 2020-11-10 LAB — LACTATE DEHYDROGENASE: LDH: 97 U/L — ABNORMAL LOW (ref 98–192)

## 2020-11-10 LAB — HIV ANTIBODY (ROUTINE TESTING W REFLEX): HIV Screen 4th Generation wRfx: NONREACTIVE

## 2020-11-10 LAB — FERRITIN: Ferritin: 24 ng/mL (ref 11–307)

## 2020-11-10 NOTE — Progress Notes (Signed)
Patient here to establish care  
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
## 2020-11-11 LAB — HEPATITIS PANEL, ACUTE
HCV Ab: NONREACTIVE
Hep A IgM: NONREACTIVE
Hep B C IgM: NONREACTIVE
Hepatitis B Surface Ag: NONREACTIVE

## 2020-11-21 ENCOUNTER — Other Ambulatory Visit: Payer: Self-pay

## 2020-11-21 ENCOUNTER — Ambulatory Visit (INDEPENDENT_AMBULATORY_CARE_PROVIDER_SITE_OTHER): Payer: Medicare Other

## 2020-11-21 ENCOUNTER — Inpatient Hospital Stay (HOSPITAL_BASED_OUTPATIENT_CLINIC_OR_DEPARTMENT_OTHER): Payer: Medicare Other | Admitting: Oncology

## 2020-11-21 ENCOUNTER — Ambulatory Visit (INDEPENDENT_AMBULATORY_CARE_PROVIDER_SITE_OTHER): Payer: Medicare Other | Admitting: Vascular Surgery

## 2020-11-21 ENCOUNTER — Encounter: Payer: Self-pay | Admitting: Oncology

## 2020-11-21 VITALS — BP 115/64 | HR 81 | Temp 99.8°F | Resp 20 | Wt 161.3 lb

## 2020-11-21 VITALS — BP 111/73 | HR 72 | Ht <= 58 in | Wt 161.0 lb

## 2020-11-21 DIAGNOSIS — D631 Anemia in chronic kidney disease: Secondary | ICD-10-CM

## 2020-11-21 DIAGNOSIS — I82423 Acute embolism and thrombosis of iliac vein, bilateral: Secondary | ICD-10-CM

## 2020-11-21 DIAGNOSIS — N1831 Chronic kidney disease, stage 3a: Secondary | ICD-10-CM | POA: Diagnosis not present

## 2020-11-21 DIAGNOSIS — D75839 Thrombocytosis, unspecified: Secondary | ICD-10-CM

## 2020-11-21 DIAGNOSIS — D509 Iron deficiency anemia, unspecified: Secondary | ICD-10-CM

## 2020-11-21 LAB — PATHOLOGIST SMEAR REVIEW

## 2020-11-21 NOTE — Progress Notes (Signed)
Patient denies new problems/concerns today.   °
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
## 2020-11-22 ENCOUNTER — Telehealth: Payer: Self-pay

## 2020-11-22 NOTE — Telephone Encounter (Signed)
-----   Message from Earlie Server, MD sent at 11/21/2020  7:00 PM EST ----- Patient know that I talked to the pathologist for smear is ok, but needs to be watched  Please arrange her to follow up in 4 months, lab 1 week prior. Ordered.

## 2020-11-22 NOTE — Telephone Encounter (Signed)
Done.. Pt appts has been sched as requested Labs 1 week prior to MD visit in 4 mths A detailed message was let on pts VM making her aware A NEW reminder letter will be mailed out

## 2020-11-22 NOTE — Telephone Encounter (Signed)
Please schedule as MD recommends and inform patient of appt details. 

## 2020-11-27 ENCOUNTER — Encounter (INDEPENDENT_AMBULATORY_CARE_PROVIDER_SITE_OTHER): Payer: Self-pay | Admitting: Vascular Surgery

## 2021-01-16 ENCOUNTER — Other Ambulatory Visit: Payer: Self-pay | Admitting: Family Medicine

## 2021-01-16 DIAGNOSIS — M79621 Pain in right upper arm: Secondary | ICD-10-CM

## 2021-01-16 DIAGNOSIS — Z853 Personal history of malignant neoplasm of breast: Secondary | ICD-10-CM

## 2021-01-24 ENCOUNTER — Other Ambulatory Visit: Payer: Self-pay

## 2021-01-24 ENCOUNTER — Encounter: Payer: Self-pay | Admitting: Emergency Medicine

## 2021-01-24 ENCOUNTER — Emergency Department
Admission: EM | Admit: 2021-01-24 | Discharge: 2021-01-25 | Disposition: A | Discharge status: Home / Self Care | Payer: Medicare Other | Attending: Emergency Medicine | Admitting: Emergency Medicine

## 2021-01-24 ENCOUNTER — Emergency Department: Payer: Medicare Other

## 2021-01-24 DIAGNOSIS — Z7901 Long term (current) use of anticoagulants: Secondary | ICD-10-CM | POA: Insufficient documentation

## 2021-01-24 DIAGNOSIS — J45909 Unspecified asthma, uncomplicated: Secondary | ICD-10-CM | POA: Diagnosis not present

## 2021-01-24 DIAGNOSIS — Z7982 Long term (current) use of aspirin: Secondary | ICD-10-CM | POA: Insufficient documentation

## 2021-01-24 DIAGNOSIS — Z79899 Other long term (current) drug therapy: Secondary | ICD-10-CM | POA: Insufficient documentation

## 2021-01-24 DIAGNOSIS — E119 Type 2 diabetes mellitus without complications: Secondary | ICD-10-CM | POA: Diagnosis not present

## 2021-01-24 DIAGNOSIS — U071 COVID-19: Secondary | ICD-10-CM | POA: Diagnosis not present

## 2021-01-24 DIAGNOSIS — Z853 Personal history of malignant neoplasm of breast: Secondary | ICD-10-CM | POA: Diagnosis not present

## 2021-01-24 DIAGNOSIS — Z28310 Unvaccinated for covid-19: Secondary | ICD-10-CM | POA: Diagnosis not present

## 2021-01-24 DIAGNOSIS — R0602 Shortness of breath: Secondary | ICD-10-CM | POA: Diagnosis present

## 2021-01-24 DIAGNOSIS — Z85038 Personal history of other malignant neoplasm of large intestine: Secondary | ICD-10-CM | POA: Diagnosis not present

## 2021-01-24 LAB — CBC WITH DIFFERENTIAL/PLATELET
Abs Immature Granulocytes: 0.03 10*3/uL (ref 0.00–0.07)
Basophils Absolute: 0 10*3/uL (ref 0.0–0.1)
Basophils Relative: 1 %
Eosinophils Absolute: 0.3 10*3/uL (ref 0.0–0.5)
Eosinophils Relative: 4 %
HCT: 42.4 % (ref 36.0–46.0)
Hemoglobin: 13.6 g/dL (ref 12.0–15.0)
Immature Granulocytes: 0 %
Lymphocytes Relative: 49 %
Lymphs Abs: 3.8 10*3/uL (ref 0.7–4.0)
MCH: 29.5 pg (ref 26.0–34.0)
MCHC: 32.1 g/dL (ref 30.0–36.0)
MCV: 92 fL (ref 80.0–100.0)
Monocytes Absolute: 0.8 10*3/uL (ref 0.1–1.0)
Monocytes Relative: 10 %
Neutro Abs: 2.8 10*3/uL (ref 1.7–7.7)
Neutrophils Relative %: 36 %
Platelets: 287 10*3/uL (ref 150–400)
RBC: 4.61 MIL/uL (ref 3.87–5.11)
RDW: 19 % — ABNORMAL HIGH (ref 11.5–15.5)
WBC: 7.8 10*3/uL (ref 4.0–10.5)
nRBC: 0 % (ref 0.0–0.2)

## 2021-01-24 LAB — COMPREHENSIVE METABOLIC PANEL
ALT: 41 U/L (ref 0–44)
AST: 35 U/L (ref 15–41)
Albumin: 3.7 g/dL (ref 3.5–5.0)
Alkaline Phosphatase: 109 U/L (ref 38–126)
Anion gap: 10 (ref 5–15)
BUN: 17 mg/dL (ref 8–23)
CO2: 17 mmol/L — ABNORMAL LOW (ref 22–32)
Calcium: 9.2 mg/dL (ref 8.9–10.3)
Chloride: 110 mmol/L (ref 98–111)
Creatinine, Ser: 1.06 mg/dL — ABNORMAL HIGH (ref 0.44–1.00)
GFR, Estimated: 53 mL/min — ABNORMAL LOW (ref >60)
Glucose, Bld: 130 mg/dL — ABNORMAL HIGH (ref 70–99)
Potassium: 4.7 mmol/L (ref 3.5–5.1)
Sodium: 137 mmol/L (ref 135–145)
Total Bilirubin: 0.7 mg/dL (ref 0.3–1.2)
Total Protein: 8 g/dL (ref 6.5–8.1)

## 2021-01-24 MED ORDER — COMPRESSOR/NEBULIZER MISC: 1.0000 [IU] | 0 refills | Status: DC | PRN

## 2021-01-24 MED ORDER — PREDNISONE 20 MG PO TABS
50.0000 mg | ORAL_TABLET | Freq: Once | ORAL | Status: DC
  Filled 2021-01-24: qty 2

## 2021-01-24 MED ORDER — PREDNISONE 50 MG PO TABS: ORAL_TABLET | ORAL | 0 refills | Status: DC

## 2021-01-24 MED ORDER — ALBUTEROL SULFATE (2.5 MG/3ML) 0.083% IN NEBU: 2.5000 mg | INHALATION_SOLUTION | RESPIRATORY_TRACT | 0 refills | Status: AC | PRN

## 2021-01-24 NOTE — ED Triage Notes (Signed)
Pt to triage via w/c with no distress noted; st +COVID home test on Wed; c/o frequent dry cough unrelieved by tessalon rx by PCP; pt denies any accomp symptoms

## 2021-01-24 NOTE — ED Triage Notes (Addendum)
Patient present to ER with chief complaint of shortness of breath for the past week. Denies fevers. Home COVID test positive on Wednesday. Productive cough. Hx asthma. Has been taking Tessalon pearls with no relief in cough. Also complaining of left sided rib pain with coughing.Tachypnic.
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
## 2021-01-25 ENCOUNTER — Telehealth: Payer: Self-pay

## 2021-01-25 LAB — BLOOD CULTURE ID PANEL (REFLEXED) - BCID2

## 2021-01-25 LAB — TROPONIN I (HIGH SENSITIVITY): Troponin I (High Sensitivity): 2 ng/L (ref <18)

## 2021-01-25 LAB — LACTIC ACID, PLASMA: Lactic Acid, Venous: 1.2 mmol/L (ref 0.5–1.9)

## 2021-01-25 NOTE — Discharge Instructions (Addendum)
1.  Finish steroid as prescribed. 2.  You may use albuterol nebulizer every 4 hours as needed for cough/difficulty breathing. 3.  Return to the ER for worsening symptoms, persistent vomiting, difficulty breathing or other concerns

## 2021-01-25 NOTE — Telephone Encounter (Signed)
Called and spoke with pt's daughter and inquired about how pt was feeling. Daughter stated pt was better and not wheezing like she had been. Daughter informed that one of pt's blood cultures result. Advised daughter that if patient began to feel worse she would need to come back to ED for evaluation or follow up with her PCP. Daughter verbalized understanding.

## 2021-01-25 NOTE — ED Notes (Signed)
Dr. Beather Arbour notified patient SPO2 remained 96-97 % on room air while ambulating in room. NAD. Respirations even and unlabored.

## 2021-01-28 LAB — CULTURE, BLOOD (ROUTINE X 2): Special Requests: ADEQUATE

## 2021-01-29 NOTE — Progress Notes (Signed)
ED Antimicrobial Stewardship Positive Culture Follow Up   Nancy Hickman is an 80 y.o. female who presented to [facility] on 01/24/2021 with a chief complaint of cough and SOB. Pt with Staph hominis in blood culture 2/4 bottles (1 from each set: one anaerobic and one aerobic). Nurse did inform pt daughter about blood culture prior to discharge and to bring pt back in if she began to feel worse. D/w ED provider who recommended pt come back in to treat. Called pt on 4/24 to inform her of the blood culture and ED providers recommendations. She states she is feeling fine and still just has a cough. Instructed pt to monitor for any fevers or if she begins to feel worse to come back to the hospital.    Chief Complaint  Patient presents with  . Cough    Shortness of b   . Shortness of Breath    Recent Results (from the past 720 hour(s))  Culture, blood (routine x 2)     Status: Abnormal   Collection Time: 01/24/21 11:35 PM   Specimen: BLOOD  Result Value Ref Range Status   Specimen Description   Final    BLOOD RIGHT ANTECUBITAL Performed at [facility], [address] [place], [place] [address]    Special Requests   Final    BOTTLES DRAWN AEROBIC AND ANAEROBIC Blood Culture results may not be optimal due to an inadequate volume of blood received in culture bottles Performed at [facility], [address], [place], [place] [address]    Culture  Setup Time   Final    Organism ID to follow [place] TO, READ BACK BY AND VERIFIED WITH: HAMILTON Jenilyn Magana AT 2297 ON 01/25/21 BY SS Performed at [facility], [address] [place], [place] [address]    Culture STAPHYLOCOCCUS HOMINIS (A)  Final   Report Status 01/28/2021 FINAL  Final   Organism ID, Bacteria STAPHYLOCOCCUS HOMINIS  Final      Susceptibility   Staphylococcus hominis - MIC*    CIPROFLOXACIN 2 INTERMEDIATE Intermediate     ERYTHROMYCIN >=8  RESISTANT Resistant     GENTAMICIN <=0.5 SENSITIVE Sensitive     OXACILLIN >=4 RESISTANT Resistant     TETRACYCLINE >=16 RESISTANT Resistant     VANCOMYCIN 1 SENSITIVE Sensitive     TRIMETH/SULFA 80 RESISTANT Resistant     CLINDAMYCIN <=0.25 SENSITIVE Sensitive     RIFAMPIN <=0.5 SENSITIVE Sensitive     Inducible Clindamycin NEGATIVE Sensitive     * STAPHYLOCOCCUS HOMINIS  Culture, blood (routine x 2)     Status: Abnormal   Collection Time: 01/24/21 11:35 PM   Specimen: BLOOD  Result Value Ref Range Status   Specimen Description   Final    BLOOD LEFT ANTECUBITAL Performed at [facility], [address] [place], [place] [address]    Special Requests   Final    BOTTLES DRAWN AEROBIC AND ANAEROBIC Blood Culture adequate volume Performed at [facility], [address] [place], [place] [address]    Culture  Setup Time   Final    GRAM POSITIVE COCCI AEROBIC BOTTLE ONLY CRITICAL VALUE NOTED.  VALUE IS CONSISTENT WITH PREVIOUSLY REPORTED AND CALLED VALUE. Performed at [facility], [address] [place], [place] [address]    Culture (A)  Final    STAPHYLOCOCCUS HOMINIS SUSCEPTIBILITIES PERFORMED ON PREVIOUS CULTURE WITHIN THE LAST 5 DAYS. Performed at [facility],  [address] [place], [place] [address]    Report Status 01/28/2021 FINAL  Final  Blood Culture ID Panel (Reflexed)     Status: Abnormal   Collection Time: 01/24/21 11:35 PM  Result Value Ref Range Status   Enterococcus faecalis NOT DETECTED NOT DETECTED Final   Enterococcus Faecium NOT DETECTED NOT DETECTED Final   Listeria monocytogenes NOT DETECTED NOT DETECTED Final   Staphylococcus species DETECTED (A) NOT DETECTED Final    Comment: CRITICAL RESULT CALLED TO, READ BACK BY AND VERIFIED WITH: HAMILTON Emerie Vanderkolk AT 1740 ON 01/25/21 BY SS    Staphylococcus aureus (BCID) NOT DETECTED NOT DETECTED Final   Staphylococcus epidermidis NOT DETECTED NOT DETECTED Final    Staphylococcus lugdunensis NOT DETECTED NOT DETECTED Final   Streptococcus species NOT DETECTED NOT DETECTED Final   Streptococcus agalactiae NOT DETECTED NOT DETECTED Final   Streptococcus pneumoniae NOT DETECTED NOT DETECTED Final   Streptococcus pyogenes NOT DETECTED NOT DETECTED Final   A.calcoaceticus-baumannii NOT DETECTED NOT DETECTED Final   Bacteroides fragilis NOT DETECTED NOT DETECTED Final   Enterobacterales NOT DETECTED NOT DETECTED Final   Enterobacter cloacae complex NOT DETECTED NOT DETECTED Final   Escherichia coli NOT DETECTED NOT DETECTED Final   Klebsiella aerogenes NOT DETECTED NOT DETECTED Final   Klebsiella oxytoca NOT DETECTED NOT DETECTED Final   Klebsiella pneumoniae NOT DETECTED NOT DETECTED Final   Proteus species NOT DETECTED NOT DETECTED Final   Salmonella species NOT DETECTED NOT DETECTED Final   Serratia marcescens NOT DETECTED NOT DETECTED Final   Haemophilus influenzae NOT DETECTED NOT DETECTED Final   Neisseria meningitidis NOT DETECTED NOT DETECTED Final   Pseudomonas aeruginosa NOT DETECTED NOT DETECTED Final   Stenotrophomonas maltophilia NOT DETECTED NOT DETECTED Final   Candida albicans NOT DETECTED NOT DETECTED Final   Candida auris NOT DETECTED NOT DETECTED Final   Candida glabrata NOT DETECTED NOT DETECTED Final   Candida krusei NOT DETECTED NOT DETECTED Final   Candida parapsilosis NOT DETECTED NOT DETECTED Final   Candida tropicalis NOT DETECTED NOT DETECTED Final   Cryptococcus neoformans/gattii NOT DETECTED NOT DETECTED Final    Comment: Performed at [facility], [address], [place], [place] [address]     ED Provider: Dr. Vladimir Crofts   Sherilyn Banker, PharmD Pharmacy Resident  01/29/2021 10:07 AM

## 2021-03-23 ENCOUNTER — Inpatient Hospital Stay: Payer: Medicare Other | Attending: Oncology

## 2021-03-23 ENCOUNTER — Other Ambulatory Visit: Payer: Self-pay

## 2021-03-23 DIAGNOSIS — D509 Iron deficiency anemia, unspecified: Secondary | ICD-10-CM

## 2021-03-23 DIAGNOSIS — Z79899 Other long term (current) drug therapy: Secondary | ICD-10-CM | POA: Insufficient documentation

## 2021-03-23 DIAGNOSIS — N1831 Chronic kidney disease, stage 3a: Secondary | ICD-10-CM

## 2021-03-23 DIAGNOSIS — Z7901 Long term (current) use of anticoagulants: Secondary | ICD-10-CM | POA: Diagnosis not present

## 2021-03-23 DIAGNOSIS — D631 Anemia in chronic kidney disease: Secondary | ICD-10-CM | POA: Insufficient documentation

## 2021-03-23 DIAGNOSIS — N189 Chronic kidney disease, unspecified: Secondary | ICD-10-CM | POA: Insufficient documentation

## 2021-03-23 DIAGNOSIS — Z7952 Long term (current) use of systemic steroids: Secondary | ICD-10-CM | POA: Insufficient documentation

## 2021-03-23 DIAGNOSIS — Z7982 Long term (current) use of aspirin: Secondary | ICD-10-CM | POA: Insufficient documentation

## 2021-03-23 DIAGNOSIS — D75839 Thrombocytosis, unspecified: Secondary | ICD-10-CM

## 2021-03-23 DIAGNOSIS — I4891 Unspecified atrial fibrillation: Secondary | ICD-10-CM | POA: Insufficient documentation

## 2021-03-23 LAB — CBC WITH DIFFERENTIAL/PLATELET
Abs Immature Granulocytes: 0.02 10*3/uL (ref 0.00–0.07)
Basophils Absolute: 0.1 10*3/uL (ref 0.0–0.1)
Basophils Relative: 1 %
Eosinophils Absolute: 0.3 10*3/uL (ref 0.0–0.5)
Eosinophils Relative: 5 %
HCT: 37.6 % (ref 36.0–46.0)
Hemoglobin: 12.5 g/dL (ref 12.0–15.0)
Immature Granulocytes: 0 %
Lymphocytes Relative: 40 %
Lymphs Abs: 2.8 10*3/uL (ref 0.7–4.0)
MCH: 31.9 pg (ref 26.0–34.0)
MCHC: 33.2 g/dL (ref 30.0–36.0)
MCV: 95.9 fL (ref 80.0–100.0)
Monocytes Absolute: 0.6 10*3/uL (ref 0.1–1.0)
Monocytes Relative: 8 %
Neutro Abs: 3.2 10*3/uL (ref 1.7–7.7)
Neutrophils Relative %: 46 %
Platelets: 321 10*3/uL (ref 150–400)
RBC: 3.92 MIL/uL (ref 3.87–5.11)
RDW: 16.4 % — ABNORMAL HIGH (ref 11.5–15.5)
WBC: 6.9 10*3/uL (ref 4.0–10.5)
nRBC: 0 % (ref 0.0–0.2)

## 2021-03-23 LAB — PATHOLOGIST SMEAR REVIEW

## 2021-03-23 LAB — IRON AND TIBC
Iron: 186 ug/dL — ABNORMAL HIGH (ref 28–170)
Saturation Ratios: 57 % — ABNORMAL HIGH (ref 10.4–31.8)
TIBC: 329 ug/dL (ref 250–450)
UIBC: 143 ug/dL

## 2021-03-23 LAB — FERRITIN: Ferritin: 50 ng/mL (ref 11–307)

## 2021-03-25 LAB — PROTEIN ELECTROPHORESIS, SERUM
A/G Ratio: 1.1 (ref 0.7–1.7)
Albumin ELP: 4 g/dL (ref 2.9–4.4)
Alpha-1-Globulin: 0.2 g/dL (ref 0.0–0.4)
Alpha-2-Globulin: 0.9 g/dL (ref 0.4–1.0)
Beta Globulin: 1.1 g/dL (ref 0.7–1.3)
Gamma Globulin: 1.4 g/dL (ref 0.4–1.8)
Globulin, Total: 3.5 g/dL (ref 2.2–3.9)
Total Protein ELP: 7.5 g/dL (ref 6.0–8.5)

## 2021-03-30 ENCOUNTER — Encounter: Payer: Self-pay | Admitting: Nurse Practitioner

## 2021-03-30 ENCOUNTER — Inpatient Hospital Stay (HOSPITAL_BASED_OUTPATIENT_CLINIC_OR_DEPARTMENT_OTHER): Payer: Medicare Other | Admitting: Nurse Practitioner

## 2021-03-30 ENCOUNTER — Other Ambulatory Visit: Payer: Self-pay

## 2021-03-30 VITALS — BP 99/65 | HR 71 | Temp 97.4°F | Resp 18 | Wt 159.4 lb

## 2021-03-30 DIAGNOSIS — N1831 Chronic kidney disease, stage 3a: Secondary | ICD-10-CM | POA: Diagnosis not present

## 2021-03-30 DIAGNOSIS — D631 Anemia in chronic kidney disease: Secondary | ICD-10-CM | POA: Diagnosis not present

## 2021-03-30 DIAGNOSIS — D75839 Thrombocytosis, unspecified: Secondary | ICD-10-CM | POA: Diagnosis not present
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
## 2021-03-30 NOTE — Progress Notes (Signed)
Pt here for follow up. No new concerns voiced.   

## 2021-04-12 ENCOUNTER — Other Ambulatory Visit: Payer: Self-pay | Admitting: Nurse Practitioner

## 2021-04-12 DIAGNOSIS — R790 Abnormal level of blood mineral: Secondary | ICD-10-CM

## 2021-04-12 NOTE — Progress Notes (Signed)
Dr. Tasia Catchings recommends testing for hemochromatosis. Left vm for patient to schedule appt for labs. Will enter orders.

## 2021-04-14 ENCOUNTER — Inpatient Hospital Stay: Payer: Medicare Other | Attending: Oncology

## 2021-04-14 ENCOUNTER — Other Ambulatory Visit: Payer: Self-pay

## 2021-04-14 DIAGNOSIS — D75839 Thrombocytosis, unspecified: Secondary | ICD-10-CM | POA: Insufficient documentation

## 2021-04-14 DIAGNOSIS — R790 Abnormal level of blood mineral: Secondary | ICD-10-CM

## 2021-04-19 LAB — HEMOCHROMATOSIS DNA-PCR(C282Y,H63D)

## 2021-05-01 ENCOUNTER — Other Ambulatory Visit: Payer: Self-pay | Admitting: Nurse Practitioner

## 2021-05-01 DIAGNOSIS — D75839 Thrombocytosis, unspecified: Secondary | ICD-10-CM

## 2021-05-01 NOTE — Progress Notes (Signed)
Heterozygous for hemochromatosis. Isolated elevated iron saturation is fine. Plan to repeat cbc and iron studies prior to next visit.

## 2021-07-05 ENCOUNTER — Ambulatory Visit (INDEPENDENT_AMBULATORY_CARE_PROVIDER_SITE_OTHER): Payer: Medicare Other | Admitting: Cardiology

## 2021-07-05 ENCOUNTER — Ambulatory Visit (INDEPENDENT_AMBULATORY_CARE_PROVIDER_SITE_OTHER): Payer: Medicare Other

## 2021-07-05 ENCOUNTER — Other Ambulatory Visit: Payer: Self-pay

## 2021-07-05 VITALS — BP 116/70 | HR 82 | Ht <= 58 in | Wt 171.0 lb

## 2021-07-05 DIAGNOSIS — I480 Paroxysmal atrial fibrillation: Secondary | ICD-10-CM

## 2021-07-05 DIAGNOSIS — R002 Palpitations: Secondary | ICD-10-CM

## 2021-07-05 DIAGNOSIS — I82423 Acute embolism and thrombosis of iliac vein, bilateral: Secondary | ICD-10-CM | POA: Diagnosis not present
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
## 2021-07-05 NOTE — Patient Instructions (Addendum)
Medication Instructions:  Your physician recommends that you continue on your current medications as directed. Please refer to the Current Medication list given to you today. *If you need a refill on your cardiac medications before your next appointment, please call your pharmacy*  Lab Work: None ordered. If you have labs (blood work) drawn today and your tests are completely normal, you will receive your results only by: [facility] (if you have MyChart) OR A paper copy in the mail If you have any lab test that is abnormal or we need to change your treatment, we will call you to review the results.  Testing/Procedures: Your physician has recommended that you wear a holter monitor. Holter monitors are medical devices that record the heart's electrical activity. Doctors most often use these monitors to diagnose arrhythmias. Arrhythmias are problems with the speed or rhythm of the heartbeat. The monitor is a small, portable device. You can wear one while you do your normal daily activities. This is usually used to diagnose what is causing palpitations/syncope (passing out).  You will wear a 14 day ZIO monitor  Follow-Up: At [facility], you and your health needs are our priority.  As part of our continuing mission to provide you with exceptional heart care, we have created designated Provider Care Teams.  These Care Teams include your primary Cardiologist (physician) and Advanced Practice Providers (APPs -  Physician Assistants and Nurse Practitioners) who all work together to provide you with the care you need, when you need it.  Your next appointment:   Your physician wants you to follow-up in: 6 months with one of the following Advanced Practice Providers on your designated Care Team:   Murray Hodgkins, NP Christell Faith, PA-C Marrianne Mood, PA-C Cadence Kathlen Mody, PA-C You will receive a reminder letter in the mail two months in advance. If you don't receive a letter, please call our  office to schedule the follow-up appointment.   Your physician has recommended that you wear a Zio monitor.   This monitor is a medical device that records the heart's electrical activity. Doctors most often use these monitors to diagnose arrhythmias. Arrhythmias are problems with the speed or rhythm of the heartbeat. The monitor is a small device applied to your chest. You can wear one while you do your normal daily activities. While wearing this monitor if you have any symptoms to push the button and record what you felt. Once you have worn this monitor for the period of time provider prescribed (Usually 14 days), you will return the monitor device in the postage paid box. Once it is returned they will download the data collected and provide [place] with a report which the provider will then review and we will call you with those results. Important tips:  Avoid showering during the first 24 hours of wearing the monitor. Avoid excessive sweating to help maximize wear time. Do not submerge the device, no hot tubs, and no swimming pools. Keep any lotions or oils away from the patch. After 24 hours you may shower with the patch on. Take brief showers with your back facing the shower head.  Do not remove patch once it has been placed because that will interrupt data and decrease adhesive wear time. Push the button when you have any symptoms and write down what you were feeling. Once you have completed wearing your monitor, remove and place into box which has postage paid and place in your outgoing mailbox.  If for some reason you have misplaced  your box then call our office and we can provide another box and/or mail it off for you.

## 2021-07-19 DIAGNOSIS — R002 Palpitations: Secondary | ICD-10-CM

## 2021-07-27 ENCOUNTER — Other Ambulatory Visit: Payer: Self-pay

## 2021-07-27 ENCOUNTER — Inpatient Hospital Stay: Payer: Medicare Other | Attending: Oncology

## 2021-07-27 DIAGNOSIS — D631 Anemia in chronic kidney disease: Secondary | ICD-10-CM

## 2021-07-27 DIAGNOSIS — E1122 Type 2 diabetes mellitus with diabetic chronic kidney disease: Secondary | ICD-10-CM | POA: Insufficient documentation

## 2021-07-27 DIAGNOSIS — D75839 Thrombocytosis, unspecified: Secondary | ICD-10-CM | POA: Diagnosis not present

## 2021-07-27 DIAGNOSIS — I441 Atrioventricular block, second degree: Secondary | ICD-10-CM

## 2021-07-27 DIAGNOSIS — D649 Anemia, unspecified: Secondary | ICD-10-CM | POA: Insufficient documentation

## 2021-07-27 DIAGNOSIS — I4719 Other supraventricular tachycardia: Secondary | ICD-10-CM

## 2021-07-27 HISTORY — DX: Other supraventricular tachycardia: I47.19

## 2021-07-27 HISTORY — DX: Atrioventricular block, second degree: I44.1

## 2021-07-27 LAB — CBC WITH DIFFERENTIAL/PLATELET
Abs Immature Granulocytes: 0.03 10*3/uL (ref 0.00–0.07)
Basophils Absolute: 0.1 10*3/uL (ref 0.0–0.1)
Basophils Relative: 1 %
Eosinophils Absolute: 0.3 10*3/uL (ref 0.0–0.5)
Eosinophils Relative: 5 %
HCT: 41.7 % (ref 36.0–46.0)
Hemoglobin: 13.8 g/dL (ref 12.0–15.0)
Immature Granulocytes: 1 %
Lymphocytes Relative: 34 %
Lymphs Abs: 2.1 10*3/uL (ref 0.7–4.0)
MCH: 32.7 pg (ref 26.0–34.0)
MCHC: 33.1 g/dL (ref 30.0–36.0)
MCV: 98.8 fL (ref 80.0–100.0)
Monocytes Absolute: 0.5 10*3/uL (ref 0.1–1.0)
Monocytes Relative: 8 %
Neutro Abs: 3.2 10*3/uL (ref 1.7–7.7)
Neutrophils Relative %: 51 %
Platelets: 346 10*3/uL (ref 150–400)
RBC: 4.22 MIL/uL (ref 3.87–5.11)
RDW: 14.6 % (ref 11.5–15.5)
WBC: 6.2 10*3/uL (ref 4.0–10.5)
nRBC: 0 % (ref 0.0–0.2)

## 2021-07-27 LAB — FERRITIN: Ferritin: 29 ng/mL (ref 11–307)

## 2021-07-27 LAB — COMPREHENSIVE METABOLIC PANEL
ALT: 20 U/L (ref 0–44)
AST: 30 U/L (ref 15–41)
Albumin: 4 g/dL (ref 3.5–5.0)
Alkaline Phosphatase: 98 U/L (ref 38–126)
Anion gap: 10 (ref 5–15)
BUN: 12 mg/dL (ref 8–23)
CO2: 20 mmol/L — ABNORMAL LOW (ref 22–32)
Calcium: 9.6 mg/dL (ref 8.9–10.3)
Chloride: 108 mmol/L (ref 98–111)
Creatinine, Ser: 1.24 mg/dL — ABNORMAL HIGH (ref 0.44–1.00)
GFR, Estimated: 44 mL/min — ABNORMAL LOW (ref >60)
Glucose, Bld: 120 mg/dL — ABNORMAL HIGH (ref 70–99)
Potassium: 4 mmol/L (ref 3.5–5.1)
Sodium: 138 mmol/L (ref 135–145)
Total Bilirubin: 0.6 mg/dL (ref 0.3–1.2)
Total Protein: 8 g/dL (ref 6.5–8.1)

## 2021-07-27 LAB — IRON AND TIBC
Iron: 153 ug/dL (ref 28–170)
Saturation Ratios: 41 % — ABNORMAL HIGH (ref 10.4–31.8)
TIBC: 375 ug/dL (ref 250–450)
UIBC: 222 ug/dL

## 2021-08-04 ENCOUNTER — Inpatient Hospital Stay (HOSPITAL_BASED_OUTPATIENT_CLINIC_OR_DEPARTMENT_OTHER): Payer: Medicare Other | Admitting: Oncology

## 2021-08-04 ENCOUNTER — Other Ambulatory Visit: Payer: Self-pay

## 2021-08-04 ENCOUNTER — Encounter: Payer: Self-pay | Admitting: Oncology

## 2021-08-04 VITALS — BP 120/82 | HR 87 | Temp 97.3°F | Resp 18 | Wt 172.8 lb

## 2021-08-04 DIAGNOSIS — R790 Abnormal level of blood mineral: Secondary | ICD-10-CM | POA: Diagnosis not present

## 2021-08-04 DIAGNOSIS — Z148 Genetic carrier of other disease: Secondary | ICD-10-CM

## 2021-08-04 DIAGNOSIS — D649 Anemia, unspecified: Secondary | ICD-10-CM | POA: Diagnosis not present

## 2021-08-04 DIAGNOSIS — D75839 Thrombocytosis, unspecified: Secondary | ICD-10-CM

## 2021-08-04 NOTE — Progress Notes (Signed)
Pt here for follow up. No new concerns voiced.   
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
## 2021-08-09 ENCOUNTER — Other Ambulatory Visit (INDEPENDENT_AMBULATORY_CARE_PROVIDER_SITE_OTHER): Payer: Self-pay | Admitting: Vascular Surgery

## 2021-08-09 ENCOUNTER — Telehealth (INDEPENDENT_AMBULATORY_CARE_PROVIDER_SITE_OTHER): Payer: Self-pay

## 2021-08-09 DIAGNOSIS — M7989 Other specified soft tissue disorders: Secondary | ICD-10-CM

## 2021-08-09 NOTE — Telephone Encounter (Signed)
She can come in with a DVT study only.  If positive, we will work her in but if negative, the swelling is likely postphlebitic in nature.  People that have had blood clots can have swelling that persists.  She should be wearing compression socks daily.  If it is negative, she can be scheduled to come in and discuss swelling at a later date.

## 2021-08-11 ENCOUNTER — Ambulatory Visit (INDEPENDENT_AMBULATORY_CARE_PROVIDER_SITE_OTHER): Payer: Medicare Other

## 2021-08-11 ENCOUNTER — Other Ambulatory Visit: Payer: Self-pay

## 2021-08-11 DIAGNOSIS — M7989 Other specified soft tissue disorders: Secondary | ICD-10-CM | POA: Diagnosis not present

## 2022-01-03 ENCOUNTER — Ambulatory Visit: Payer: Medicare Other | Admitting: Cardiology

## 2022-01-31 ENCOUNTER — Inpatient Hospital Stay: Payer: Medicare Other | Attending: Nurse Practitioner

## 2022-01-31 ENCOUNTER — Other Ambulatory Visit: Payer: Self-pay | Admitting: Emergency Medicine

## 2022-01-31 DIAGNOSIS — Z148 Genetic carrier of other disease: Secondary | ICD-10-CM

## 2022-01-31 DIAGNOSIS — E1122 Type 2 diabetes mellitus with diabetic chronic kidney disease: Secondary | ICD-10-CM | POA: Insufficient documentation

## 2022-01-31 DIAGNOSIS — D75838 Other thrombocytosis: Secondary | ICD-10-CM | POA: Insufficient documentation

## 2022-01-31 DIAGNOSIS — R790 Abnormal level of blood mineral: Secondary | ICD-10-CM

## 2022-01-31 DIAGNOSIS — D75839 Thrombocytosis, unspecified: Secondary | ICD-10-CM

## 2022-01-31 DIAGNOSIS — N1832 Chronic kidney disease, stage 3b: Secondary | ICD-10-CM | POA: Diagnosis not present

## 2022-01-31 DIAGNOSIS — D631 Anemia in chronic kidney disease: Secondary | ICD-10-CM | POA: Insufficient documentation

## 2022-01-31 LAB — COMPREHENSIVE METABOLIC PANEL
ALT: 19 U/L (ref 0–44)
AST: 27 U/L (ref 15–41)
Albumin: 3.9 g/dL (ref 3.5–5.0)
Alkaline Phosphatase: 90 U/L (ref 38–126)
Anion gap: 9 (ref 5–15)
BUN: 25 mg/dL — ABNORMAL HIGH (ref 8–23)
CO2: 20 mmol/L — ABNORMAL LOW (ref 22–32)
Calcium: 9.2 mg/dL (ref 8.9–10.3)
Chloride: 104 mmol/L (ref 98–111)
Creatinine, Ser: 1.57 mg/dL — ABNORMAL HIGH (ref 0.44–1.00)
GFR, Estimated: 33 mL/min — ABNORMAL LOW (ref >60)
Glucose, Bld: 109 mg/dL — ABNORMAL HIGH (ref 70–99)
Potassium: 4.4 mmol/L (ref 3.5–5.1)
Sodium: 133 mmol/L — ABNORMAL LOW (ref 135–145)
Total Bilirubin: 0.4 mg/dL (ref 0.3–1.2)
Total Protein: 7.7 g/dL (ref 6.5–8.1)

## 2022-01-31 LAB — CBC WITH DIFFERENTIAL/PLATELET
Abs Immature Granulocytes: 0.02 10*3/uL (ref 0.00–0.07)
Basophils Absolute: 0.1 10*3/uL (ref 0.0–0.1)
Basophils Relative: 1 %
Eosinophils Absolute: 0.4 10*3/uL (ref 0.0–0.5)
Eosinophils Relative: 6 %
HCT: 41.7 % (ref 36.0–46.0)
Hemoglobin: 13.7 g/dL (ref 12.0–15.0)
Immature Granulocytes: 0 %
Lymphocytes Relative: 44 %
Lymphs Abs: 2.9 10*3/uL (ref 0.7–4.0)
MCH: 31.6 pg (ref 26.0–34.0)
MCHC: 32.9 g/dL (ref 30.0–36.0)
MCV: 96.1 fL (ref 80.0–100.0)
Monocytes Absolute: 0.7 10*3/uL (ref 0.1–1.0)
Monocytes Relative: 10 %
Neutro Abs: 2.6 10*3/uL (ref 1.7–7.7)
Neutrophils Relative %: 39 %
Platelets: 323 10*3/uL (ref 150–400)
RBC: 4.34 MIL/uL (ref 3.87–5.11)
RDW: 14.6 % (ref 11.5–15.5)
WBC: 6.6 10*3/uL (ref 4.0–10.5)
nRBC: 0 % (ref 0.0–0.2)

## 2022-01-31 LAB — IRON AND TIBC
Iron: 100 ug/dL (ref 28–170)
Saturation Ratios: 25 % (ref 10.4–31.8)
TIBC: 398 ug/dL (ref 250–450)
UIBC: 298 ug/dL

## 2022-01-31 LAB — FERRITIN: Ferritin: 16 ng/mL (ref 11–307)

## 2022-02-01 ENCOUNTER — Other Ambulatory Visit: Payer: Self-pay | Admitting: Emergency Medicine

## 2022-02-01 DIAGNOSIS — D631 Anemia in chronic kidney disease: Secondary | ICD-10-CM

## 2022-02-02 ENCOUNTER — Inpatient Hospital Stay
Admission: RE | Admit: 2022-02-02 | Discharge: 2022-02-02 | Disposition: A | Discharge status: Home / Self Care | Payer: Self-pay | Source: Ambulatory Visit | Attending: *Deleted | Admitting: *Deleted

## 2022-02-02 ENCOUNTER — Other Ambulatory Visit: Payer: Self-pay | Admitting: *Deleted

## 2022-02-02 ENCOUNTER — Encounter: Payer: Self-pay | Admitting: Nurse Practitioner

## 2022-02-02 ENCOUNTER — Inpatient Hospital Stay (HOSPITAL_BASED_OUTPATIENT_CLINIC_OR_DEPARTMENT_OTHER): Payer: Medicare Other | Admitting: Nurse Practitioner

## 2022-02-02 ENCOUNTER — Ambulatory Visit
Admission: RE | Admit: 2022-02-02 | Discharge: 2022-02-02 | Disposition: A | Discharge status: Home / Self Care | Payer: Self-pay | Source: Ambulatory Visit | Attending: *Deleted | Admitting: *Deleted

## 2022-02-02 ENCOUNTER — Other Ambulatory Visit: Payer: Self-pay

## 2022-02-02 VITALS — BP 110/86 | HR 82 | Temp 96.9°F | Resp 18 | Wt 172.7 lb

## 2022-02-02 DIAGNOSIS — Z148 Genetic carrier of other disease: Secondary | ICD-10-CM | POA: Diagnosis not present

## 2022-02-02 DIAGNOSIS — D75838 Other thrombocytosis: Secondary | ICD-10-CM | POA: Diagnosis not present

## 2022-02-02 DIAGNOSIS — Z1231 Encounter for screening mammogram for malignant neoplasm of breast: Secondary | ICD-10-CM

## 2022-02-02 DIAGNOSIS — D75839 Thrombocytosis, unspecified: Secondary | ICD-10-CM

## 2022-02-02 DIAGNOSIS — D631 Anemia in chronic kidney disease: Secondary | ICD-10-CM | POA: Insufficient documentation

## 2022-02-02 DIAGNOSIS — N1832 Chronic kidney disease, stage 3b: Secondary | ICD-10-CM
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
## 2022-02-05 ENCOUNTER — Other Ambulatory Visit: Payer: Self-pay | Admitting: Family Medicine

## 2022-02-05 DIAGNOSIS — Z1231 Encounter for screening mammogram for malignant neoplasm of breast: Secondary | ICD-10-CM

## 2022-02-07 LAB — BCR-ABL1 FISH
Cells Analyzed: 200
Cells Counted: 200

## 2022-02-09 ENCOUNTER — Telehealth: Payer: Self-pay | Admitting: *Deleted

## 2022-02-09 LAB — JAK2 V617F RFX CALR/MPL/E12-15

## 2022-02-09 LAB — CALR +MPL + E12-E15  (REFLEX)

## 2022-02-09 NOTE — Telephone Encounter (Signed)
Per message from Beckey Rutter, NP. Lab results are normal and patient to keep follow up as scheduled for repeat labs and see Dr. Tasia Catchings. Patient did not answer. Left voicemail to return call with any questions or concerns.  ?

## 2022-02-26 ENCOUNTER — Ambulatory Visit
Admission: RE | Admit: 2022-02-26 | Discharge: 2022-02-26 | Disposition: A | Discharge status: Home / Self Care | Payer: Medicare Other | Source: Ambulatory Visit | Attending: Family Medicine | Admitting: Family Medicine

## 2022-02-26 DIAGNOSIS — Z1231 Encounter for screening mammogram for malignant neoplasm of breast: Secondary | ICD-10-CM | POA: Insufficient documentation

## 2022-04-09 ENCOUNTER — Other Ambulatory Visit: Payer: Self-pay

## 2022-04-09 ENCOUNTER — Emergency Department: Payer: Medicare Other

## 2022-04-09 ENCOUNTER — Emergency Department
Admission: EM | Admit: 2022-04-09 | Discharge: 2022-04-09 | Disposition: A | Discharge status: Home / Self Care | Payer: Medicare Other | Attending: Emergency Medicine | Admitting: Emergency Medicine

## 2022-04-09 ENCOUNTER — Encounter: Payer: Self-pay | Admitting: Intensive Care

## 2022-04-09 DIAGNOSIS — E1165 Type 2 diabetes mellitus with hyperglycemia: Secondary | ICD-10-CM | POA: Insufficient documentation

## 2022-04-09 DIAGNOSIS — M545 Low back pain, unspecified: Secondary | ICD-10-CM | POA: Insufficient documentation

## 2022-04-09 DIAGNOSIS — R42 Dizziness and giddiness: Secondary | ICD-10-CM

## 2022-04-09 DIAGNOSIS — R109 Unspecified abdominal pain: Secondary | ICD-10-CM | POA: Insufficient documentation

## 2022-04-09 LAB — BASIC METABOLIC PANEL
Anion gap: 7 (ref 5–15)
BUN: 17 mg/dL (ref 8–23)
CO2: 21 mmol/L — ABNORMAL LOW (ref 22–32)
Calcium: 10 mg/dL (ref 8.9–10.3)
Chloride: 112 mmol/L — ABNORMAL HIGH (ref 98–111)
Creatinine, Ser: 1.14 mg/dL — ABNORMAL HIGH (ref 0.44–1.00)
GFR, Estimated: 49 mL/min — ABNORMAL LOW (ref >60)
Glucose, Bld: 125 mg/dL — ABNORMAL HIGH (ref 70–99)
Potassium: 4.5 mmol/L (ref 3.5–5.1)
Sodium: 140 mmol/L (ref 135–145)

## 2022-04-09 LAB — CBC WITH DIFFERENTIAL/PLATELET
Abs Immature Granulocytes: 0.02 10*3/uL (ref 0.00–0.07)
Basophils Absolute: 0.1 10*3/uL (ref 0.0–0.1)
Basophils Relative: 1 %
Eosinophils Absolute: 0.3 10*3/uL (ref 0.0–0.5)
Eosinophils Relative: 5 %
HCT: 41.7 % (ref 36.0–46.0)
Hemoglobin: 13.3 g/dL (ref 12.0–15.0)
Immature Granulocytes: 0 %
Lymphocytes Relative: 36 %
Lymphs Abs: 2.6 10*3/uL (ref 0.7–4.0)
MCH: 31.7 pg (ref 26.0–34.0)
MCHC: 31.9 g/dL (ref 30.0–36.0)
MCV: 99.3 fL (ref 80.0–100.0)
Monocytes Absolute: 0.6 10*3/uL (ref 0.1–1.0)
Monocytes Relative: 8 %
Neutro Abs: 3.6 10*3/uL (ref 1.7–7.7)
Neutrophils Relative %: 50 %
Platelets: 365 10*3/uL (ref 150–400)
RBC: 4.2 MIL/uL (ref 3.87–5.11)
RDW: 16.5 % — ABNORMAL HIGH (ref 11.5–15.5)
WBC: 7.3 10*3/uL (ref 4.0–10.5)
nRBC: 0 % (ref 0.0–0.2)

## 2022-04-09 LAB — CBG MONITORING, ED: Glucose-Capillary: 81 mg/dL (ref 70–99)

## 2022-04-09 LAB — BETA-HYDROXYBUTYRIC ACID: Beta-Hydroxybutyric Acid: 0.11 mmol/L (ref 0.05–0.27)

## 2022-04-09 MED ORDER — BLOOD GLUCOSE MONITOR KIT
PACK | 0 refills | Status: AC
Start: 2022-04-09 — End: ?

## 2022-04-09 NOTE — ED Triage Notes (Signed)
Patient c/o hyperglycemia and meter reading high at home. Since last Wednesday patient has been feeling nausea, shaky and back pain. Takes metformin daily.

## 2022-04-09 NOTE — ED Provider Triage Note (Signed)
Emergency Medicine Provider Triage Evaluation Note  Nancy Hickman , a 81 y.o. female  was evaluated in triage.  Pt complains of high blood sugar. Reports it was >400 yesterday and today the glucometer read "high". She reports that she has felt shakey for 1 week. Also reports low back pain x1 week. Takes metformin only, no insulin. Has not missed any doses. No abdominal pain, no vomiting. No dysuria. No fever/chills. No pain different than normal "arthritis pain."  Review of Systems  Positive: "Shakey", back pain, nausea Negative: Abd pain, vomiting,   Physical Exam  LMP  (LMP Unknown)  Gen:   Awake, no distress   Resp:  Normal effort  MSK:   Moves extremities without difficulty  Other:    Medical Decision Making  Medically screening exam initiated at 2:04 PM.  Appropriate orders placed.  Nancy Hickman was informed that the remainder of the evaluation will be completed by another provider, this initial triage assessment does not replace that evaluation, and the importance of remaining in the ED until their evaluation is complete.     Nancy Old, PA-C 04/09/22 1408

## 2022-04-09 NOTE — ED Provider Notes (Signed)
[facility] Provider Note    Event Date/Time   First MD Initiated Contact with Patient 04/09/22 1737     (approximate)  History   Chief Complaint: Hyperglycemia  HPI  Nancy Hickman is a 81 y.o. female with a past medical history of anemia, diabetes, presents to the emergency department for hyperglycemia.  According to the patient for the past couple days she has been feeling somewhat dizzy or weak at times.  Patient states her blood sugars have been elevated over the past few days and today her glucose meter read "high."  Patient states she has been experiencing some pain in her lower back but denies any dysuria.  Denies any chest pain or shortness of breath.  No abdominal pain.  Patient states the main reason she came today was because of her blood sugars being high.  Physical Exam   Triage Vital Signs: ED Triage Vitals  Enc Vitals Group     BP 04/09/22 1407 127/78     Pulse Rate 04/09/22 1407 85     Resp 04/09/22 1407 18     Temp 04/09/22 1407 98.9 F (37.2 C)     Temp Source 04/09/22 1407 Oral     SpO2 04/09/22 1407 93 %     Weight 04/09/22 1408 168 lb (76.2 kg)     Height 04/09/22 1408 $RemoveBefor'4\' 10"'[id number]$  (1.473 m)     Head Circumference --      Peak Flow --      Pain Score 04/09/22 1407 6     Pain Loc --      Pain Edu? --      Excl. in [place]? --     Most recent vital signs: Vitals:   04/09/22 1407 04/09/22 1830  BP: 127/78 126/74  Pulse: 85 88  Resp: 18 18  Temp: 98.9 F (37.2 C)   SpO2: 93% 100%    General: Awake, no distress.  CV:  Good peripheral perfusion.  Regular rate and rhythm  Resp:  Normal effort.  Equal breath sounds bilaterally.  Abd:  No distention.  Soft, nontender.  No rebound or guarding.   ED Results / Procedures / Treatments   EKG  EKG viewed and interpreted by myself shows a normal sinus rhythm at 82 bpm with a narrow QRS, normal axis, normal intervals no concerning ST changes.  RADIOLOGY  I have reviewed and  interpreted the CT images.  Patient appears to have a large abdominal hernia, I do not see any signs of obstruction and I do not see any signs of kidney stones. Radiology is read the CT is negative for stones.  Patient has a right-sided ileostomy with a hernia.    MEDICATIONS ORDERED IN ED: Medications - No data to display   IMPRESSION / MDM / [place] / ED COURSE  I reviewed the triage vital signs and the nursing notes.  Patient's presentation is most consistent with acute presentation with potential threat to life or bodily function.  Patient presents to the emergency department for hyperglycemia as well as some dizziness and lower back pain.  Overall the patient appears well, no distress.  Patient's lab work in the emergency department is reassuring.  CBC is normal, chemistry shows a blood glucose of 125 with a normal anion gap.  Recheck several hours later CBG shows a blood glucose of 81 without ER intervention.  Patient states she does not take insulin and only takes metformin for diabetes.  I suspect the patient's  meter was an accurate at home.  CT scan of the abdomen and pelvis does not show any acute abnormality.  We will check a urinalysis to rule out urinary tract infection.  Given the patient's reassuring work-up I believe the patient will likely be able to be discharged home with a new blood glucose monitor kit.  Patient agreeable to plan of care  Patient does not wish to wait for urine.  She denies any dysuria and states she will follow-up with her doctor but is ready to go home.  We will discharge patient with a new glucose meter.  FINAL CLINICAL IMPRESSION(S) / ED DIAGNOSES   Dizziness   Note:  This document was prepared using Dragon voice recognition software and may include unintentional dictation errors.   Harvest Dark, MD 04/09/22 2013

## 2022-04-09 NOTE — Discharge Instructions (Signed)
He has been seen in the emergency department today for high blood sugar.  Your blood sugar in the emergency department appears normal.  Please continue to drink plenty of nonsugar fluids at home.  Please use your newly prescribed blood glucose monitor.  Please follow-up with your doctor for recheck/reevaluation.  Return to the emergency department for any symptom personally concerning to yourself.

## 2022-05-23 ENCOUNTER — Ambulatory Visit (INDEPENDENT_AMBULATORY_CARE_PROVIDER_SITE_OTHER): Payer: Medicare Other | Admitting: Nurse Practitioner

## 2022-05-23 ENCOUNTER — Encounter: Payer: Self-pay | Admitting: *Deleted

## 2022-05-23 ENCOUNTER — Encounter: Payer: Self-pay | Admitting: Nurse Practitioner

## 2022-05-23 VITALS — BP 110/72 | HR 79 | Ht <= 58 in | Wt 167.0 lb

## 2022-05-23 DIAGNOSIS — Z86718 Personal history of other venous thrombosis and embolism: Secondary | ICD-10-CM | POA: Diagnosis not present

## 2022-05-23 DIAGNOSIS — E119 Type 2 diabetes mellitus without complications: Secondary | ICD-10-CM

## 2022-05-23 DIAGNOSIS — I480 Paroxysmal atrial fibrillation: Secondary | ICD-10-CM | POA: Diagnosis not present

## 2022-05-23 DIAGNOSIS — I951 Orthostatic hypotension: Secondary | ICD-10-CM | POA: Diagnosis not present

## 2022-05-23 MED ORDER — METOPROLOL SUCCINATE ER 25 MG PO TB24: 12.5000 mg | ORAL_TABLET | Freq: Every day | ORAL | Status: AC
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
## 2022-05-23 NOTE — Patient Instructions (Signed)
Medication Instructions:   Your physician has recommended you make the following change in your medication:   DECREASE metoprolol succinate 12.5 mg daily     - Take 1/2 tablet of current dose (metoprolol succinate 25 mg) for total of 12.5 mg daily   *If you need a refill on your cardiac medications before your next appointment, please call your pharmacy*   Lab Work:  None ordered  Testing/Procedures:  None ordered   Follow-Up: At [facility], you and your health needs are our priority.  As part of our continuing mission to provide you with exceptional heart care, we have created designated Provider Care Teams.  These Care Teams include your primary Cardiologist (physician) and Advanced Practice Providers (APPs -  Physician Assistants and Nurse Practitioners) who all work together to provide you with the care you need, when you need it.  We recommend signing up for the patient portal called "MyChart".  Sign up information is provided on this After Visit Summary.  MyChart is used to connect with patients for Virtual Visits (Telemedicine).  Patients are able to view lab/test results, encounter notes, upcoming appointments, etc.  Non-urgent messages can be sent to your provider as well.   To learn more about what you can do with MyChart, go to [email or web address].    Your next appointment:   4 - 6 month(s)  The format for your next appointment:   In Person  Provider:   You will see one of the following Advanced Practice Providers on your designated Care Team:   Murray Hodgkins, NP Christell Faith, PA-C Cadence Kathlen Mody, [place]   Important Information About Sugar

## 2022-07-04 IMAGING — MG MM DIGITAL SCREENING BILAT W/ TOMO AND CAD
6 of 10 series · 6 of 30 positions shown · non-contrast
Comparison: Previous exam(s).

CLINICAL DATA: Screening.

EXAM:
DIGITAL SCREENING BILATERAL MAMMOGRAM WITH TOMOSYNTHESIS AND CAD
TECHNIQUE: Bilateral screening digital craniocaudal and mediolateral oblique
mammograms were obtained. Bilateral screening digital breast
tomosynthesis was performed. The images were evaluated with
computer-aided detection.

[R MLO synth-2D (1 of 2)]
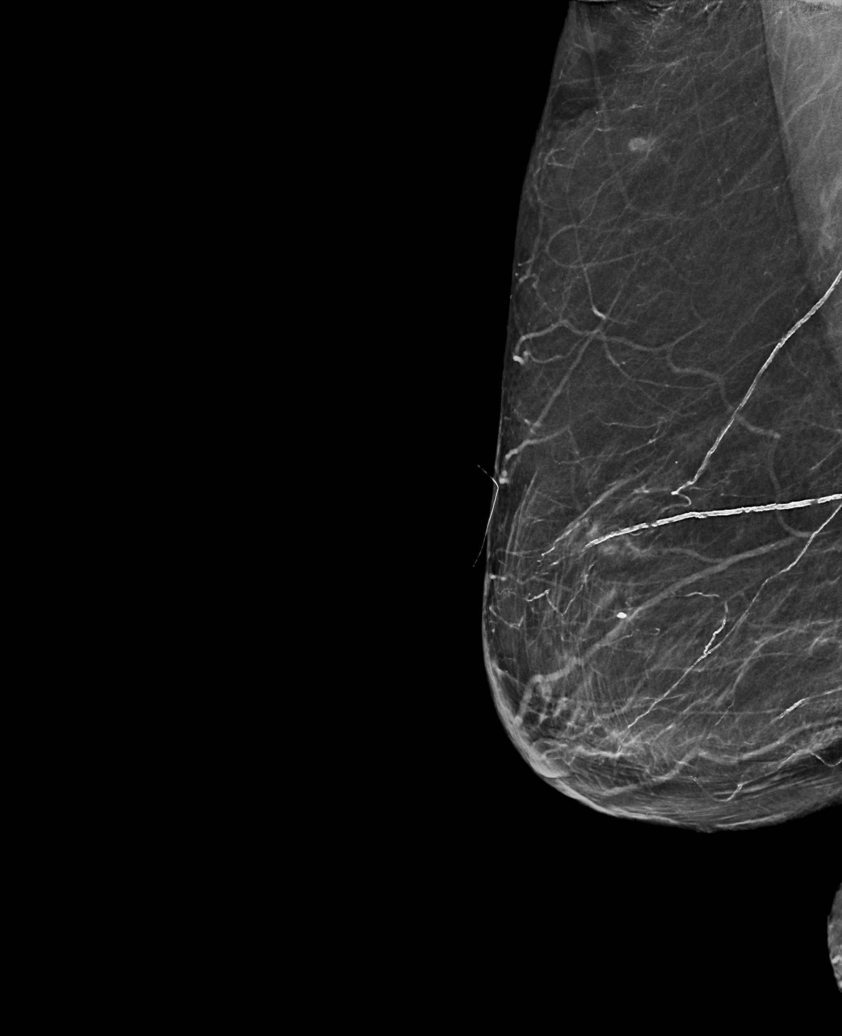

[R MLO synth-2D (2 of 2)]
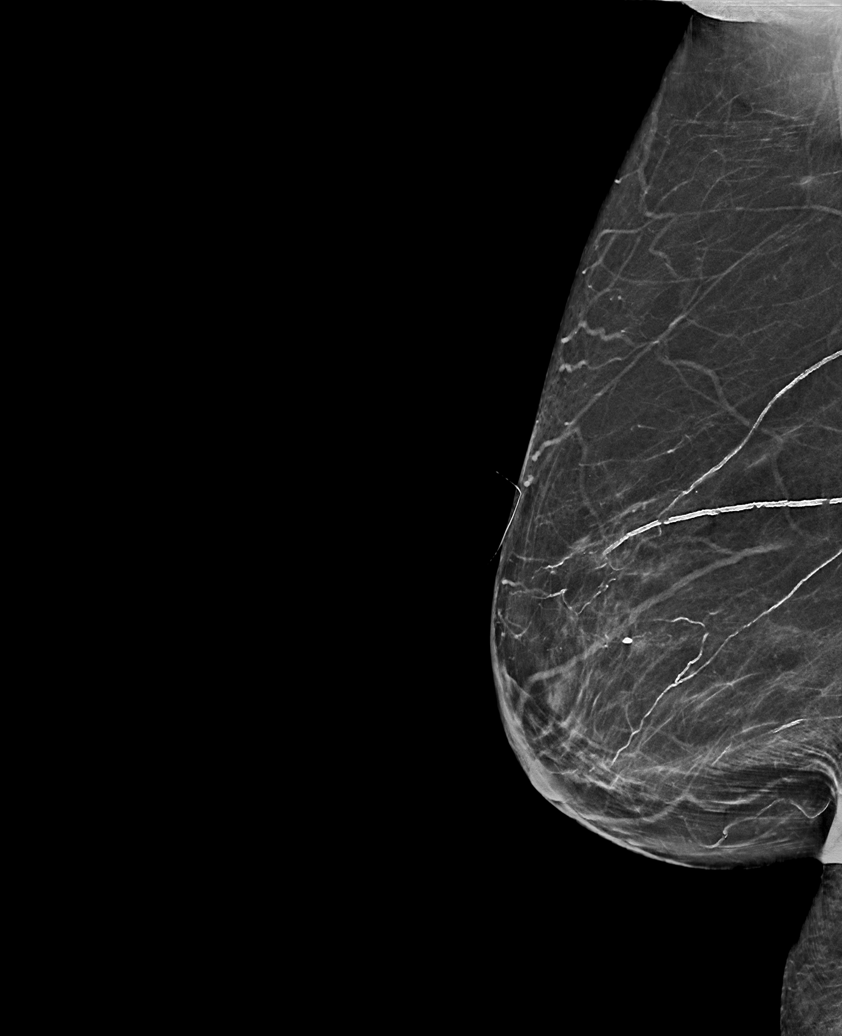

[R CC synth-2D]
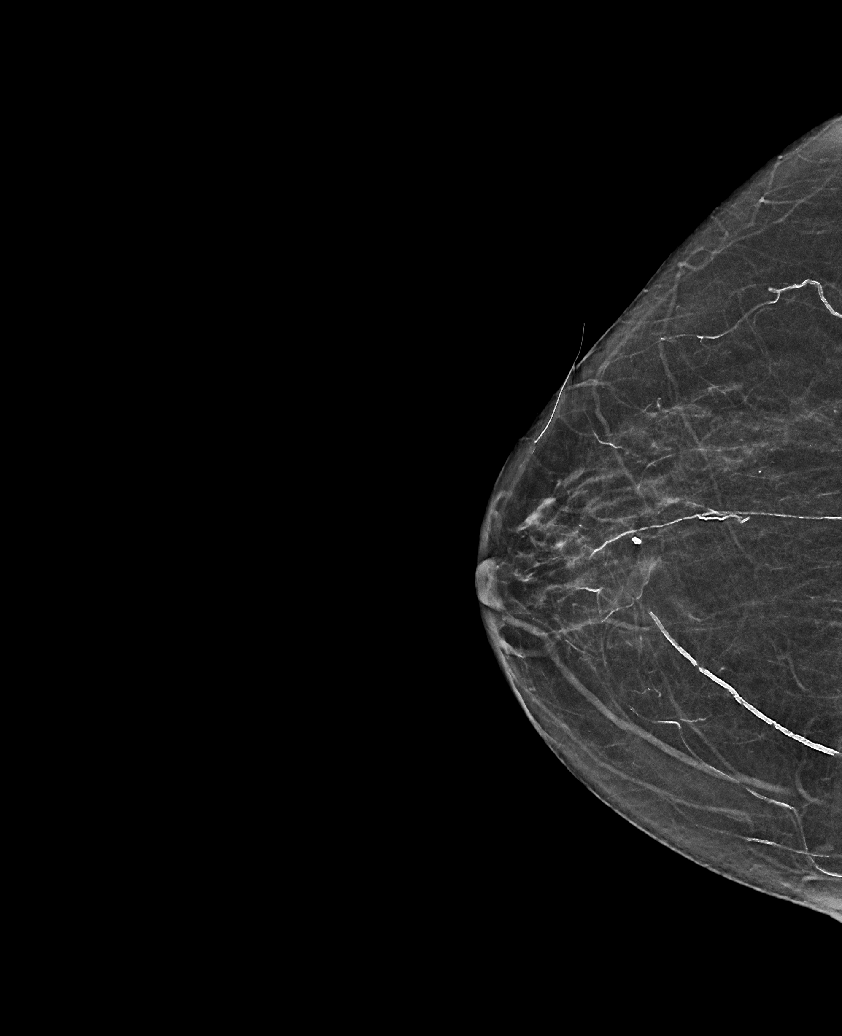

[L CC synth-2D]
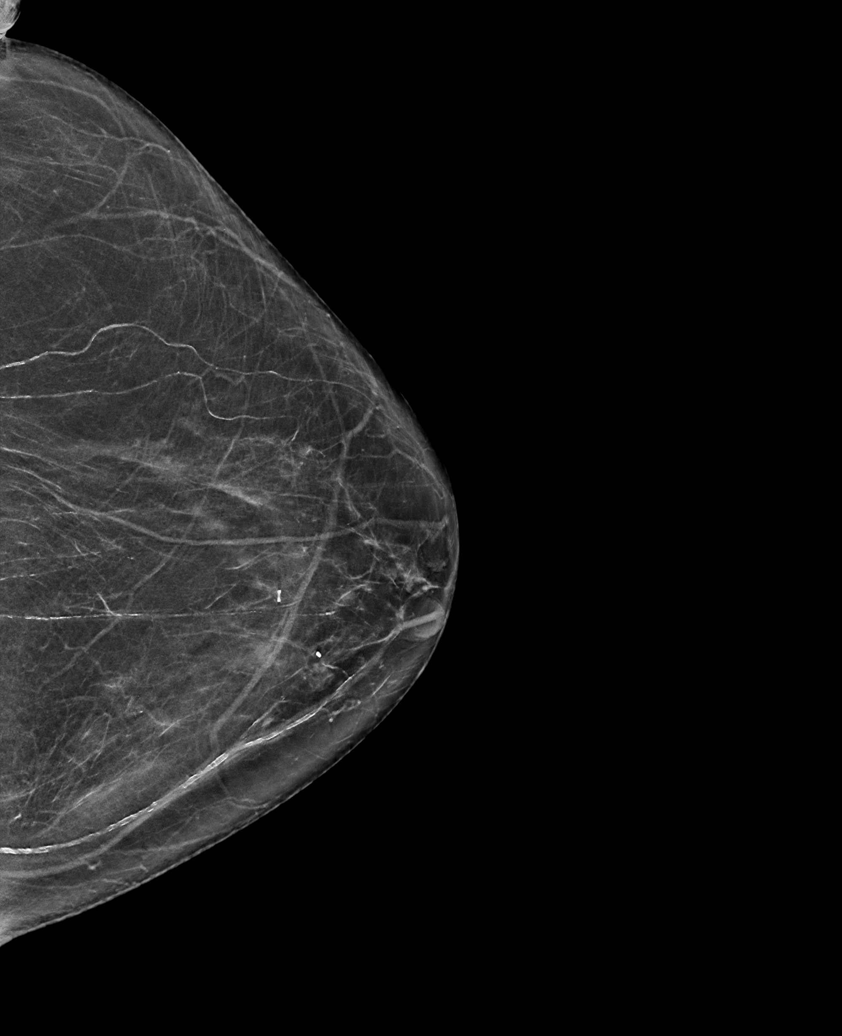

[L MLO synth-2D]
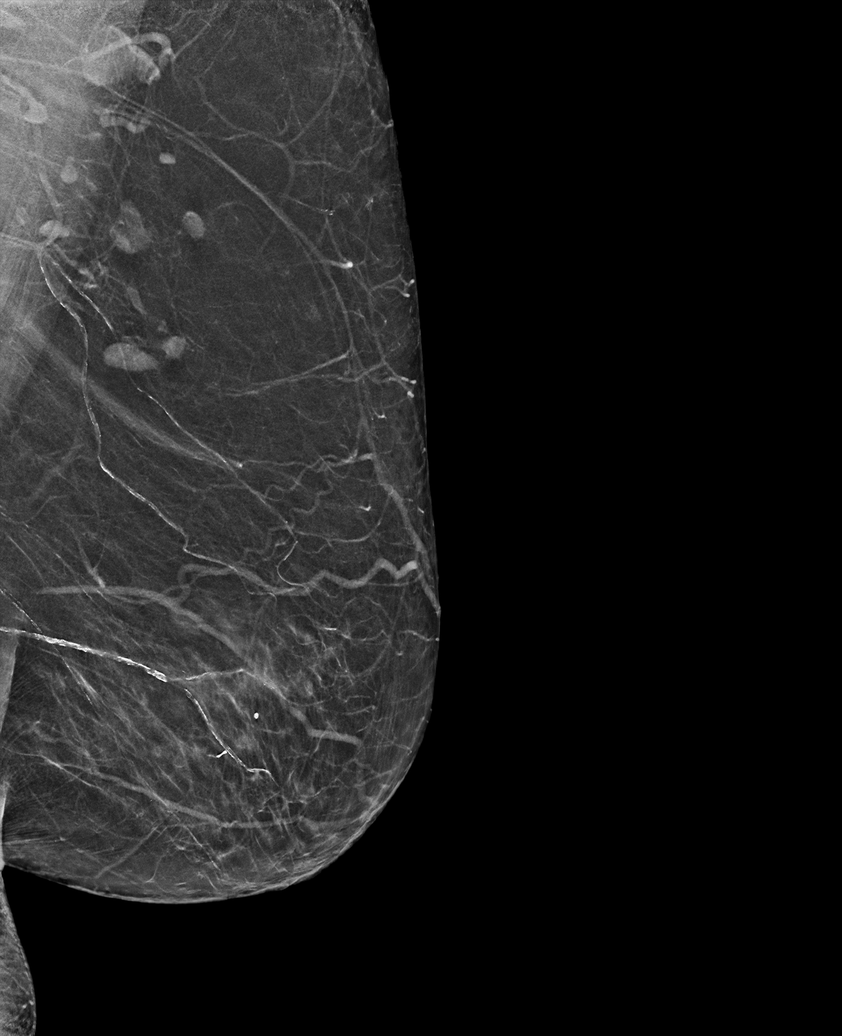

[R MLO tomo · tomo slice 31/61.0]
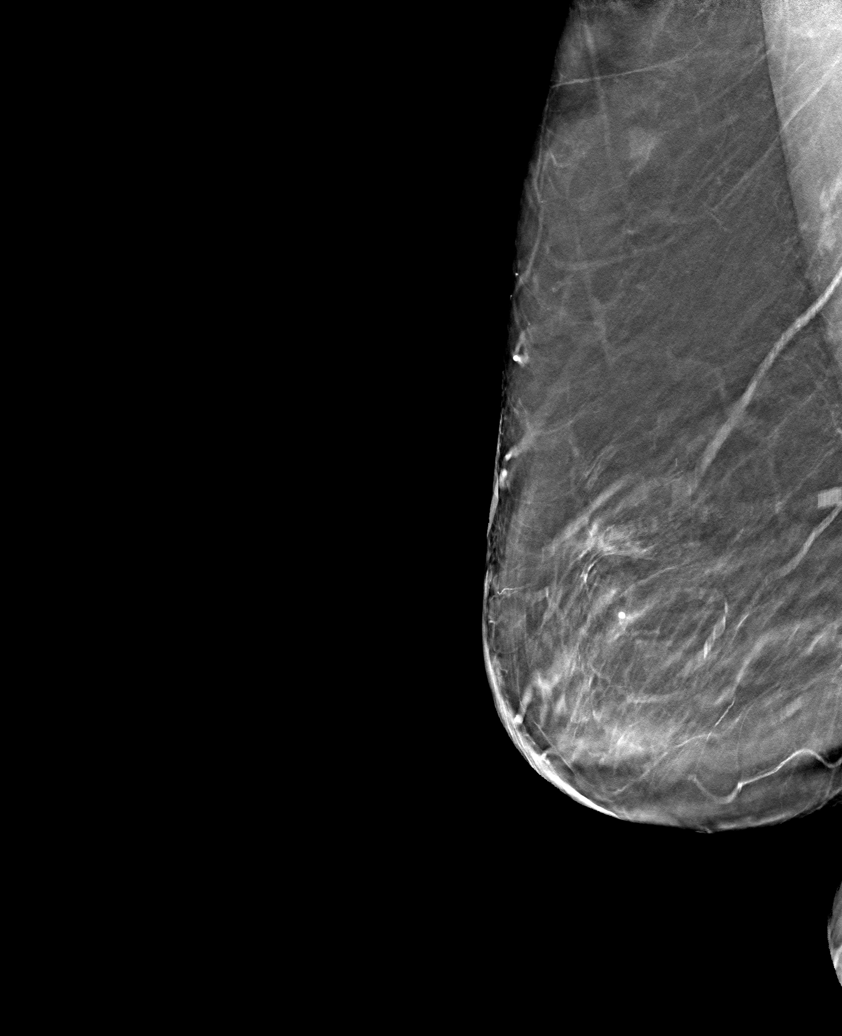

[6 of 30 positions shown; findings below may reference images not displayed]

ACR Breast Density Category b: There are scattered areas of
fibroglandular density.
FINDINGS: There are no findings suspicious for malignancy.
IMPRESSION: No mammographic evidence of malignancy. A result letter of this
screening mammogram will be mailed directly to the patient.

RECOMMENDATION:
Screening mammogram in one year. (Code:51-O-LD2)

BI-RADS CATEGORY  1: Negative.

## 2022-08-02 ENCOUNTER — Other Ambulatory Visit: Payer: Self-pay

## 2022-08-02 DIAGNOSIS — D75839 Thrombocytosis, unspecified: Secondary | ICD-10-CM

## 2022-08-03 ENCOUNTER — Inpatient Hospital Stay (HOSPITAL_BASED_OUTPATIENT_CLINIC_OR_DEPARTMENT_OTHER): Payer: Medicare Other | Admitting: Oncology

## 2022-08-03 ENCOUNTER — Inpatient Hospital Stay: Payer: Medicare Other | Attending: Oncology

## 2022-08-03 ENCOUNTER — Encounter: Payer: Self-pay | Admitting: Oncology

## 2022-08-03 VITALS — BP 123/80 | HR 72 | Temp 96.0°F | Resp 16 | Wt 166.0 lb

## 2022-08-03 DIAGNOSIS — Z148 Genetic carrier of other disease: Secondary | ICD-10-CM

## 2022-08-03 DIAGNOSIS — D649 Anemia, unspecified: Secondary | ICD-10-CM | POA: Diagnosis not present

## 2022-08-03 DIAGNOSIS — D75839 Thrombocytosis, unspecified: Secondary | ICD-10-CM | POA: Diagnosis not present

## 2022-08-03 DIAGNOSIS — K439 Ventral hernia without obstruction or gangrene: Secondary | ICD-10-CM | POA: Insufficient documentation

## 2022-08-03 DIAGNOSIS — Z86718 Personal history of other venous thrombosis and embolism: Secondary | ICD-10-CM | POA: Diagnosis not present

## 2022-08-03 DIAGNOSIS — Z853 Personal history of malignant neoplasm of breast: Secondary | ICD-10-CM | POA: Insufficient documentation

## 2022-08-03 DIAGNOSIS — Z9081 Acquired absence of spleen: Secondary | ICD-10-CM | POA: Diagnosis not present

## 2022-08-03 DIAGNOSIS — E119 Type 2 diabetes mellitus without complications: Secondary | ICD-10-CM | POA: Diagnosis not present

## 2022-08-03 DIAGNOSIS — Z933 Colostomy status: Secondary | ICD-10-CM | POA: Diagnosis not present

## 2022-08-03 DIAGNOSIS — Z87891 Personal history of nicotine dependence: Secondary | ICD-10-CM | POA: Diagnosis not present

## 2022-08-03 LAB — CBC WITH DIFFERENTIAL/PLATELET
Abs Immature Granulocytes: 0.07 10*3/uL (ref 0.00–0.07)
Basophils Absolute: 0.1 10*3/uL (ref 0.0–0.1)
Basophils Relative: 1 %
Eosinophils Absolute: 0.4 10*3/uL (ref 0.0–0.5)
Eosinophils Relative: 5 %
HCT: 38.3 % (ref 36.0–46.0)
Hemoglobin: 12.4 g/dL (ref 12.0–15.0)
Immature Granulocytes: 1 %
Lymphocytes Relative: 39 %
Lymphs Abs: 2.7 10*3/uL (ref 0.7–4.0)
MCH: 31.6 pg (ref 26.0–34.0)
MCHC: 32.4 g/dL (ref 30.0–36.0)
MCV: 97.5 fL (ref 80.0–100.0)
Monocytes Absolute: 0.6 10*3/uL (ref 0.1–1.0)
Monocytes Relative: 9 %
Neutro Abs: 3 10*3/uL (ref 1.7–7.7)
Neutrophils Relative %: 45 %
Platelets: 357 10*3/uL (ref 150–400)
RBC: 3.93 MIL/uL (ref 3.87–5.11)
RDW: 15 % (ref 11.5–15.5)
WBC: 6.9 10*3/uL (ref 4.0–10.5)
nRBC: 0 % (ref 0.0–0.2)

## 2022-08-03 LAB — COMPREHENSIVE METABOLIC PANEL
ALT: 17 U/L (ref 0–44)
AST: 26 U/L (ref 15–41)
Albumin: 3.6 g/dL (ref 3.5–5.0)
Alkaline Phosphatase: 86 U/L (ref 38–126)
Anion gap: 8 (ref 5–15)
BUN: 16 mg/dL (ref 8–23)
CO2: 22 mmol/L (ref 22–32)
Calcium: 9.2 mg/dL (ref 8.9–10.3)
Chloride: 107 mmol/L (ref 98–111)
Creatinine, Ser: 1.17 mg/dL — ABNORMAL HIGH (ref 0.44–1.00)
GFR, Estimated: 47 mL/min — ABNORMAL LOW (ref >60)
Glucose, Bld: 108 mg/dL — ABNORMAL HIGH (ref 70–99)
Potassium: 4.3 mmol/L (ref 3.5–5.1)
Sodium: 137 mmol/L (ref 135–145)
Total Bilirubin: 0.4 mg/dL (ref 0.3–1.2)
Total Protein: 7.4 g/dL (ref 6.5–8.1)

## 2022-08-03 LAB — IRON AND TIBC
Iron: 104 ug/dL (ref 28–170)
Saturation Ratios: 28 % (ref 10.4–31.8)
TIBC: 374 ug/dL (ref 250–450)
UIBC: 270 ug/dL

## 2022-08-03 LAB — FERRITIN: Ferritin: 14 ng/mL (ref 11–307)

## 2022-08-04 NOTE — Assessment & Plan Note (Signed)
Labs are reviewed and discussed with patient. Ferritin 14, no need for phlebotomy Avoid alcohol, recommend patient to have first-degree relatives screened for hemochromatosis.
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
## 2022-08-06 ENCOUNTER — Encounter (INDEPENDENT_AMBULATORY_CARE_PROVIDER_SITE_OTHER): Payer: Self-pay

## 2022-08-23 ENCOUNTER — Other Ambulatory Visit: Payer: Self-pay | Admitting: Sports Medicine

## 2022-08-23 DIAGNOSIS — M19011 Primary osteoarthritis, right shoulder: Secondary | ICD-10-CM

## 2022-08-23 DIAGNOSIS — G8929 Other chronic pain: Secondary | ICD-10-CM

## 2022-10-17 ENCOUNTER — Ambulatory Visit
Admission: RE | Admit: 2022-10-17 | Discharge: 2022-10-17 | Disposition: A | Discharge status: Home / Self Care | Payer: 59 | Source: Ambulatory Visit | Attending: Sports Medicine | Admitting: Sports Medicine

## 2022-10-17 DIAGNOSIS — M25511 Pain in right shoulder: Secondary | ICD-10-CM | POA: Insufficient documentation

## 2022-10-17 DIAGNOSIS — M19011 Primary osteoarthritis, right shoulder: Secondary | ICD-10-CM | POA: Diagnosis present

## 2022-10-17 DIAGNOSIS — G8929 Other chronic pain: Secondary | ICD-10-CM

## 2022-10-18 ENCOUNTER — Telehealth: Payer: Self-pay | Admitting: *Deleted

## 2022-10-18 ENCOUNTER — Telehealth: Payer: Self-pay | Admitting: Cardiology

## 2022-10-18 NOTE — Telephone Encounter (Signed)
   Pre-operative Risk Assessment    Patient Name: Nancy Hickman  DOB: 1941/08/19 MRN: [id number]      Request for Surgical Clearance    Procedure:   R Reverse shoulder arthroplasty biceps tenodesis  Date of Surgery:  Clearance 11/20/22                                 Surgeon:  Leim Fabry Surgeon's Group or Practice Name:  [facility] Phone number:  [phone] Fax number:  [phone]   Type of Clearance Requested:   - Medical    Type of Anesthesia:  Not Indicated   Additional requests/questions:    [name]   10/18/2022, 3:29 PM

## 2022-10-18 NOTE — Telephone Encounter (Signed)
Pt agreeable to tele pre op appt 11/08/22 @ 2 pm. Med rec and consent are done.

## 2022-10-18 NOTE — Telephone Encounter (Signed)
Primary Cardiologist:CAMERON Pryor Curia, MD   Preoperative team, please contact this patient and set up a phone call appointment for further preoperative risk assessment. Please obtain consent and complete medication review. Thank you for your help.   I confirm that guidance regarding antiplatelet and oral anticoagulation therapy has been completed and, if necessary, noted below (none requested). Patient does take aspirin but not for cardiac indication.    Emmaline Life, NP-C  10/18/2022, 4:30 PM [address], [address] Office [phone] Fax [phone]

## 2022-10-18 NOTE — Telephone Encounter (Signed)
Pt agreeable to tele pre op appt 11/08/22 @ 2 pm. Med rec and consent are done.     Patient Consent for Virtual Visit        Nancy Hickman has provided verbal consent on 10/18/2022 for a virtual visit (video or telephone).   CONSENT FOR VIRTUAL VISIT FOR:  Nancy Hickman  By participating in this virtual visit I agree to the following:  I hereby voluntarily request, consent and authorize [facility] and its employed or contracted physicians, physician assistants, nurse practitioners or other licensed health care professionals (the Practitioner), to provide me with telemedicine health care services (the "Services") as deemed necessary by the treating Practitioner. I acknowledge and consent to receive the Services by the Practitioner via telemedicine. I understand that the telemedicine visit will involve communicating with the Practitioner through live audiovisual communication technology and the disclosure of certain medical information by electronic transmission. I acknowledge that I have been given the opportunity to request an in-person assessment or other available alternative prior to the telemedicine visit and am voluntarily participating in the telemedicine visit.  I understand that I have the right to withhold or withdraw my consent to the use of telemedicine in the course of my care at any time, without affecting my right to future care or treatment, and that the Practitioner or I may terminate the telemedicine visit at any time. I understand that I have the right to inspect all information obtained and/or recorded in the course of the telemedicine visit and may receive copies of available information for a reasonable fee.  I understand that some of the potential risks of receiving the Services via telemedicine include:  Delay or interruption in medical evaluation due to technological equipment failure or disruption; Information transmitted may not be sufficient (e.g. poor  resolution of images) to allow for appropriate medical decision making by the Practitioner; and/or  In rare instances, security protocols could fail, causing a breach of personal health information.  Furthermore, I acknowledge that it is my responsibility to provide information about my medical history, conditions and care that is complete and accurate to the best of my ability. I acknowledge that Practitioner's advice, recommendations, and/or decision may be based on factors not within their control, such as incomplete or inaccurate data provided by me or distortions of diagnostic images or specimens that may result from electronic transmissions. I understand that the practice of medicine is not an exact science and that Practitioner makes no warranties or guarantees regarding treatment outcomes. I acknowledge that a copy of this consent can be made available to me via my patient portal ([facility]), or I can request a printed copy by calling the office of [facility].    I understand that my insurance will be billed for this visit.   I have read or had this consent read to me. I understand the contents of this consent, which adequately explains the benefits and risks of the Services being provided via telemedicine.  I have been provided ample opportunity to ask questions regarding this consent and the Services and have had my questions answered to my satisfaction. I give my informed consent for the services to be provided through the use of telemedicine in my medical care

## 2022-11-01 ENCOUNTER — Other Ambulatory Visit: Payer: Self-pay | Admitting: Orthopedic Surgery

## 2022-11-08 ENCOUNTER — Ambulatory Visit: Payer: 59 | Attending: Internal Medicine | Admitting: Physician Assistant

## 2022-11-08 ENCOUNTER — Encounter
Admission: RE | Admit: 2022-11-08 | Discharge: 2022-11-08 | Disposition: A | Discharge status: Home / Self Care | Payer: 59 | Source: Ambulatory Visit | Attending: Orthopedic Surgery | Admitting: Orthopedic Surgery

## 2022-11-08 VITALS — BP 127/72 | HR 79 | Temp 98.7°F | Resp 16 | Ht <= 58 in | Wt 160.0 lb

## 2022-11-08 DIAGNOSIS — R8290 Unspecified abnormal findings in urine: Secondary | ICD-10-CM | POA: Diagnosis not present

## 2022-11-08 DIAGNOSIS — Z01818 Encounter for other preprocedural examination: Secondary | ICD-10-CM | POA: Diagnosis present

## 2022-11-08 DIAGNOSIS — Z01810 Encounter for preprocedural cardiovascular examination: Secondary | ICD-10-CM

## 2022-11-08 DIAGNOSIS — Z01812 Encounter for preprocedural laboratory examination: Secondary | ICD-10-CM

## 2022-11-08 HISTORY — DX: Chronic kidney disease, stage 3b: N18.32

## 2022-11-08 HISTORY — DX: Unspecified cataract: H26.9

## 2022-11-08 HISTORY — DX: Unspecified asthma, uncomplicated: J45.909

## 2022-11-08 HISTORY — DX: Thrombocytosis, unspecified: D75.839

## 2022-11-08 HISTORY — DX: Other specified postprocedural states: Z98.890

## 2022-11-08 LAB — CBC WITH DIFFERENTIAL/PLATELET
Abs Immature Granulocytes: 0.03 10*3/uL (ref 0.00–0.07)
Basophils Absolute: 0.1 10*3/uL (ref 0.0–0.1)
Basophils Relative: 1 %
Eosinophils Absolute: 0.4 10*3/uL (ref 0.0–0.5)
Eosinophils Relative: 6 %
HCT: 40.6 % (ref 36.0–46.0)
Hemoglobin: 13.2 g/dL (ref 12.0–15.0)
Immature Granulocytes: 0 %
Lymphocytes Relative: 33 %
Lymphs Abs: 2.3 10*3/uL (ref 0.7–4.0)
MCH: 30.8 pg (ref 26.0–34.0)
MCHC: 32.5 g/dL (ref 30.0–36.0)
MCV: 94.9 fL (ref 80.0–100.0)
Monocytes Absolute: 0.8 10*3/uL (ref 0.1–1.0)
Monocytes Relative: 12 %
Neutro Abs: 3.3 10*3/uL (ref 1.7–7.7)
Neutrophils Relative %: 48 %
Platelets: 352 10*3/uL (ref 150–400)
RBC: 4.28 MIL/uL (ref 3.87–5.11)
RDW: 15 % (ref 11.5–15.5)
WBC: 6.9 10*3/uL (ref 4.0–10.5)
nRBC: 0 % (ref 0.0–0.2)

## 2022-11-08 LAB — COMPREHENSIVE METABOLIC PANEL
ALT: 17 U/L (ref 0–44)
AST: 23 U/L (ref 15–41)
Albumin: 3.9 g/dL (ref 3.5–5.0)
Alkaline Phosphatase: 89 U/L (ref 38–126)
Anion gap: 8 (ref 5–15)
BUN: 19 mg/dL (ref 8–23)
CO2: 22 mmol/L (ref 22–32)
Calcium: 9.7 mg/dL (ref 8.9–10.3)
Chloride: 108 mmol/L (ref 98–111)
Creatinine, Ser: 1.1 mg/dL — ABNORMAL HIGH (ref 0.44–1.00)
GFR, Estimated: 51 mL/min — ABNORMAL LOW (ref >60)
Glucose, Bld: 97 mg/dL (ref 70–99)
Potassium: 4.5 mmol/L (ref 3.5–5.1)
Sodium: 138 mmol/L (ref 135–145)
Total Bilirubin: 0.8 mg/dL (ref 0.3–1.2)
Total Protein: 7.7 g/dL (ref 6.5–8.1)

## 2022-11-08 LAB — URINALYSIS, ROUTINE W REFLEX MICROSCOPIC
Bilirubin Urine: NEGATIVE
Glucose, UA: NEGATIVE mg/dL
Hgb urine dipstick: NEGATIVE
Ketones, ur: NEGATIVE mg/dL
Nitrite: NEGATIVE
Protein, ur: 30 mg/dL — AB
Specific Gravity, Urine: 1.023 (ref 1.005–1.030)
pH: 5 (ref 5.0–8.0)

## 2022-11-08 LAB — TYPE AND SCREEN
ABO/RH(D): O POS
Antibody Screen: NEGATIVE

## 2022-11-08 LAB — SURGICAL PCR SCREEN
MRSA, PCR: NEGATIVE
Staphylococcus aureus: NEGATIVE

## 2022-11-08 NOTE — Patient Instructions (Addendum)
Your procedure is scheduled on: Tuesday, February 13 Report to the Registration Desk on the 1st floor of the [facility]. To find out your arrival time, please call [phone] between 1PM - 3PM on: Monday, February 12 If your arrival time is 6:00 am, do not arrive prior to that time as the [place] entrance doors do not open until 6:00 am.  REMEMBER: Instructions that are not followed completely may result in serious medical risk, up to and including death; or upon the discretion of your surgeon and anesthesiologist your surgery may need to be rescheduled.  Do not eat food after midnight the night before surgery.  No gum chewing, lozengers or hard candies.  You may however, drink water up to 2 hours before you are scheduled to arrive for your surgery. Do not drink anything within 2 hours of your scheduled arrival time.  In addition, your doctor has ordered for you to drink the provided  Gatorade G2 Drinking this carbohydrate drink up to two hours before surgery helps to reduce insulin resistance and improve patient outcomes. Please complete drinking 2 hours prior to scheduled arrival time.  TAKE THESE MEDICATIONS THE MORNING OF SURGERY WITH A SIP OF WATER:  Albuterol nebulizer Metoprolol Gabapentin Omeprazole (Prilosec) - (take one the night before and one on the morning of surgery - helps to prevent nausea after surgery.)  Metformin - stop 2 days before surgery. Last day to take metformin is Saturday, February 10. Resume AFTER surgery.  Bring your albuterol inhaler to the hospital.  One week prior to surgery: starting February 6 Stop Anti-inflammatories (NSAIDS) such as Advil, Aleve, Ibuprofen, Motrin, Naproxen, Naprosyn and Aspirin based products such as Excedrin, Goodys Powder, BC Powder. Stop ANY OVER THE COUNTER supplements until after surgery. You may however, continue to take Tylenol if needed for pain up until the day of surgery.  No Alcohol for 24 hours before or  after surgery.  No Smoking including e-cigarettes for 24 hours prior to surgery.  No chewable tobacco products for at least 6 hours prior to surgery.  No nicotine patches on the day of surgery.  Do not use any "recreational" drugs for at least a week prior to your surgery.  Please be advised that the combination of cocaine and anesthesia may have negative outcomes, up to and including death. If you test positive for cocaine, your surgery will be cancelled.  On the morning of surgery brush your teeth with toothpaste and water, you may rinse your mouth with mouthwash if you wish. Do not swallow any toothpaste or mouthwash.  Use CHG Soap as directed on instruction sheet.  Do not wear jewelry, make-up, hairpins, clips or nail polish.  Do not wear lotions, powders, or perfumes.   Do not shave body from the neck down 48 hours prior to surgery just in case you cut yourself which could leave a site for infection.  Also, freshly shaved skin may become irritated if using the CHG soap.  Contact lenses, hearing aids and dentures may not be worn into surgery.  Do not bring valuables to the hospital. [facility] is not responsible for any missing/lost belongings or valuables.   Total Shoulder Arthroplasty:  use Benzolyl Peroxide 5% Gel as directed on instruction sheet.  Notify your doctor if there is any change in your medical condition (cold, fever, infection).  Wear comfortable clothing (specific to your surgery type) to the hospital.  After surgery, you can help prevent lung complications by doing breathing exercises.  Take deep breaths and cough every 1-2 hours. Your doctor may order a device called an Incentive Spirometer to help you take deep breaths.  If you are being admitted to the hospital overnight, leave your suitcase in the car. After surgery it may be brought to your room.  If you are being discharged the day of surgery, you will not be allowed to drive home. You will need a  responsible individual to drive you home and stay with you for 24 hours after surgery.   If you are taking public transportation, you will need to have a responsible adult (18 years or older) with you. Please confirm with your physician that it is acceptable to use public transportation.   Please call the [place] Dept. at [phone] if you have any questions about these instructions.  Surgery Visitation Policy:  Patients undergoing a surgery or procedure may have two family members or support persons with them as long as the person is not COVID-19 positive or experiencing its symptoms.   Inpatient Visitation:    Visiting hours are 7 a.m. to 8 p.m. Up to four visitors are allowed at one time in a patient room. The visitors may rotate out with other people during the day. One designated support person (adult) may remain overnight.  Due to an increase in RSV and influenza rates and associated hospitalizations, children ages 39 and under will not be able to visit patients in [facility]. Masks continue to be strongly recommended.     Preparing for Surgery with CHLORHEXIDINE GLUCONATE (CHG) Soap  Chlorhexidine Gluconate (CHG) Soap  o An antiseptic cleaner that kills germs and bonds with the skin to continue killing germs even after washing  o Used for showering the night before surgery and morning of surgery  Before surgery, you can play an important role by reducing the number of germs on your skin.  CHG (Chlorhexidine gluconate) soap is an antiseptic cleanser which kills germs and bonds with the skin to continue killing germs even after washing.  Please do not use if you have an allergy to CHG or antibacterial soaps. If your skin becomes reddened/irritated stop using the CHG.  1. Shower the NIGHT BEFORE SURGERY and the MORNING OF SURGERY with CHG soap.  2. If you choose to wash your hair, wash your hair first as usual with your normal shampoo.  3.  After shampooing, rinse your hair and body thoroughly to remove the shampoo.  4. Use CHG as you would any other liquid soap. You can apply CHG directly to the skin and wash gently with a scrungie or a clean washcloth.  5. Apply the CHG soap to your body only from the neck down. Do not use on open wounds or open sores. Avoid contact with your eyes, ears, mouth, and genitals (private parts). Wash face and genitals (private parts) with your normal soap.  6. Wash thoroughly, paying special attention to the area where your surgery will be performed.  7. Thoroughly rinse your body with warm water.  8. Do not shower/wash with your normal soap after using and rinsing off the CHG soap.  9. Pat yourself dry with a clean towel.  10. Wear clean pajamas to bed the night before surgery.  12. Place clean sheets on your bed the night of your first shower and do not sleep with pets.  13. Shower again with the CHG soap on the day of surgery prior to arriving at the hospital.  14. Do not  apply any deodorants/lotions/powders.  15. Please wear clean clothes to the hospital.   Preparing for Total Shoulder Arthroplasty  Before surgery, you can play an important role by reducing the number of germs on your skin by using the following products:  Benzoyl Peroxide Gel  o Reduces the number of germs present on the skin  o Applied twice a day to shoulder area starting two days before surgery  Chlorhexidine Gluconate (CHG) Soap  o An antiseptic cleaner that kills germs and bonds with the skin to continue killing germs even after washing  o Used for showering the night before surgery and morning of surgery  BENZOYL PEROXIDE 5% GEL  Please do not use if you have an allergy to benzoyl peroxide. If your skin becomes reddened/irritated stop using the benzoyl peroxide.  Starting two days before surgery, apply as follows:  1. Apply benzoyl peroxide in the morning and at night. Apply after taking a shower.  If you are not taking a shower clean entire shoulder front, back, and side along with the armpit with a clean wet washcloth.  2. Place a quarter-sized dollop on your shoulder and rub in thoroughly, making sure to cover the front, back, and side of your shoulder, along with the armpit.  2 days before:  Sunday  __X__ AM __X__ PM 1 day before:  Monday _X___ AM __X__ PM  3. Do this twice a day for two days. (Last application is the night before surgery, AFTER using the CHG soap).  4. Do NOT apply benzoyl peroxide gel on the day of surgery. None on Tuesday

## 2022-11-08 NOTE — Telephone Encounter (Signed)
See separate virtual preop clearance note from today.  I have forwarded my clearance note to the surgeon's office.
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
## 2022-11-11 LAB — URINE CULTURE: Culture: 1000 — AB

## 2022-11-14 ENCOUNTER — Encounter: Payer: Self-pay | Admitting: Orthopedic Surgery
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
## 2022-11-16 ENCOUNTER — Encounter: Payer: Self-pay | Admitting: Orthopedic Surgery

## 2022-11-19 MED ORDER — SODIUM CHLORIDE 0.9 % IV SOLN: INTRAVENOUS | Status: DC

## 2022-11-19 MED ORDER — CEFAZOLIN SODIUM-DEXTROSE 2-4 GM/100ML-% IV SOLN
2.0000 g | INTRAVENOUS | Status: AC
  Administered 2022-11-20: 2 g via INTRAVENOUS

## 2022-11-19 MED ORDER — TRANEXAMIC ACID-NACL 1000-0.7 MG/100ML-% IV SOLN
1000.0000 mg | INTRAVENOUS | Status: AC
  Administered 2022-11-20: 1000 mg via INTRAVENOUS

## 2022-11-20 ENCOUNTER — Ambulatory Visit: Payer: 59 | Admitting: Urgent Care

## 2022-11-20 ENCOUNTER — Other Ambulatory Visit: Payer: Self-pay

## 2022-11-20 ENCOUNTER — Observation Stay: Payer: 59

## 2022-11-20 ENCOUNTER — Encounter
Admission: RE | Disposition: A | Discharge status: Home / Self Care | Payer: Self-pay | Source: Ambulatory Visit | Attending: Orthopedic Surgery

## 2022-11-20 ENCOUNTER — Encounter: Payer: Self-pay | Admitting: Orthopedic Surgery

## 2022-11-20 ENCOUNTER — Ambulatory Visit: Payer: 59

## 2022-11-20 ENCOUNTER — Observation Stay
Admission: RE | Admit: 2022-11-20 | Discharge: 2022-11-21 | Disposition: A | Discharge status: Home / Self Care | Payer: 59 | Source: Ambulatory Visit | Attending: Orthopedic Surgery | Admitting: Orthopedic Surgery

## 2022-11-20 DIAGNOSIS — Z86718 Personal history of other venous thrombosis and embolism: Secondary | ICD-10-CM | POA: Diagnosis not present

## 2022-11-20 DIAGNOSIS — E1122 Type 2 diabetes mellitus with diabetic chronic kidney disease: Secondary | ICD-10-CM | POA: Diagnosis not present

## 2022-11-20 DIAGNOSIS — M19011 Primary osteoarthritis, right shoulder: Principal | ICD-10-CM | POA: Insufficient documentation

## 2022-11-20 DIAGNOSIS — Z853 Personal history of malignant neoplasm of breast: Secondary | ICD-10-CM | POA: Insufficient documentation

## 2022-11-20 DIAGNOSIS — I480 Paroxysmal atrial fibrillation: Secondary | ICD-10-CM | POA: Diagnosis not present

## 2022-11-20 DIAGNOSIS — Z79899 Other long term (current) drug therapy: Secondary | ICD-10-CM | POA: Diagnosis not present

## 2022-11-20 DIAGNOSIS — J45909 Unspecified asthma, uncomplicated: Secondary | ICD-10-CM | POA: Insufficient documentation

## 2022-11-20 DIAGNOSIS — N1832 Chronic kidney disease, stage 3b: Secondary | ICD-10-CM | POA: Insufficient documentation

## 2022-11-20 DIAGNOSIS — Z01812 Encounter for preprocedural laboratory examination: Secondary | ICD-10-CM

## 2022-11-20 HISTORY — PX: REVERSE SHOULDER ARTHROPLASTY: SHX5054

## 2022-11-20 HISTORY — DX: Diaphragmatic hernia without obstruction or gangrene: K44.9

## 2022-11-20 HISTORY — DX: Genetic carrier of other disease: Z14.8

## 2022-11-20 LAB — GLUCOSE, CAPILLARY
Glucose-Capillary: 102 mg/dL — ABNORMAL HIGH (ref 70–99)
Glucose-Capillary: 130 mg/dL — ABNORMAL HIGH (ref 70–99)

## 2022-11-20 SURGERY — ARTHROPLASTY, SHOULDER, TOTAL, REVERSE
Anesthesia: General | Site: Shoulder | Laterality: Right

## 2022-11-20 MED ORDER — PROPOFOL 1000 MG/100ML IV EMUL
INTRAVENOUS | Status: AC
  Filled 2022-11-20: qty 100

## 2022-11-20 MED ORDER — DOCUSATE SODIUM 100 MG PO CAPS
100.0000 mg | ORAL_CAPSULE | Freq: Two times a day (BID) | ORAL | Status: DC
  Administered 2022-11-20: 100 mg via ORAL
  Filled 2022-11-20 (×2): qty 1

## 2022-11-20 MED ORDER — SUCCINYLCHOLINE CHLORIDE 200 MG/10ML IV SOSY
PREFILLED_SYRINGE | INTRAVENOUS | Status: DC | PRN
  Administered 2022-11-20: 80 mg via INTRAVENOUS

## 2022-11-20 MED ORDER — FENTANYL CITRATE (PF) 100 MCG/2ML IJ SOLN
INTRAMUSCULAR | Status: AC
  Filled 2022-11-20: qty 2

## 2022-11-20 MED ORDER — BUPIVACAINE LIPOSOME 1.3 % IJ SUSP
INTRAMUSCULAR | Status: DC | PRN
  Administered 2022-11-20: 10 mL via PERINEURAL

## 2022-11-20 MED ORDER — BUPIVACAINE HCL (PF) 0.5 % IJ SOLN
INTRAMUSCULAR | Status: AC
  Filled 2022-11-20: qty 10

## 2022-11-20 MED ORDER — ROCURONIUM BROMIDE 100 MG/10ML IV SOLN
INTRAVENOUS | Status: DC | PRN
  Administered 2022-11-20: 5 mg via INTRAVENOUS
  Administered 2022-11-20: 45 mg via INTRAVENOUS

## 2022-11-20 MED ORDER — PHENOL 1.4 % MT LIQD: 1.0000 | OROMUCOSAL | Status: DC | PRN

## 2022-11-20 MED ORDER — SERTRALINE HCL 50 MG PO TABS: 100.0000 mg | ORAL_TABLET | Freq: Every day | ORAL | Status: DC

## 2022-11-20 MED ORDER — OXYCODONE HCL 5 MG PO TABS: 10.0000 mg | ORAL_TABLET | ORAL | Status: DC | PRN

## 2022-11-20 MED ORDER — HYDROMORPHONE HCL 1 MG/ML IJ SOLN: 0.2000 mg | INTRAMUSCULAR | Status: DC | PRN

## 2022-11-20 MED ORDER — TRANEXAMIC ACID-NACL 1000-0.7 MG/100ML-% IV SOLN
INTRAVENOUS | Status: AC
  Administered 2022-11-20: 1000 mg via INTRAVENOUS
  Filled 2022-11-20: qty 100

## 2022-11-20 MED ORDER — BISACODYL 10 MG RE SUPP: 10.0000 mg | Freq: Every day | RECTAL | Status: DC | PRN

## 2022-11-20 MED ORDER — ALBUTEROL SULFATE (2.5 MG/3ML) 0.083% IN NEBU: 2.5000 mg | INHALATION_SOLUTION | RESPIRATORY_TRACT | Status: DC | PRN

## 2022-11-20 MED ORDER — SODIUM CHLORIDE 0.9 % IV SOLN: INTRAVENOUS | Status: DC

## 2022-11-20 MED ORDER — OXYCODONE HCL 5 MG PO TABS
5.0000 mg | ORAL_TABLET | ORAL | Status: DC | PRN
  Filled 2022-11-20: qty 1

## 2022-11-20 MED ORDER — ALBUTEROL SULFATE HFA 108 (90 BASE) MCG/ACT IN AERS: 2.0000 | INHALATION_SPRAY | RESPIRATORY_TRACT | Status: DC | PRN

## 2022-11-20 MED ORDER — ONDANSETRON HCL 4 MG/2ML IJ SOLN: 4.0000 mg | Freq: Four times a day (QID) | INTRAMUSCULAR | Status: DC | PRN

## 2022-11-20 MED ORDER — IRRISEPT - 450ML BOTTLE WITH 0.05% CHG IN STERILE WATER, USP 99.95% OPTIME
TOPICAL | Status: DC | PRN
  Administered 2022-11-20: 450 mL

## 2022-11-20 MED ORDER — ACETAMINOPHEN 10 MG/ML IV SOLN: 1000.0000 mg | Freq: Once | INTRAVENOUS | Status: DC | PRN

## 2022-11-20 MED ORDER — SODIUM CHLORIDE 0.9 % IR SOLN
Status: DC | PRN
  Administered 2022-11-20: 3012 mL

## 2022-11-20 MED ORDER — DEXAMETHASONE SODIUM PHOSPHATE 10 MG/ML IJ SOLN
INTRAMUSCULAR | Status: DC | PRN
  Administered 2022-11-20: 5 mg via INTRAVENOUS

## 2022-11-20 MED ORDER — PANTOPRAZOLE SODIUM 40 MG PO TBEC
80.0000 mg | DELAYED_RELEASE_TABLET | Freq: Every day | ORAL | Status: DC
  Administered 2022-11-21: 80 mg via ORAL
  Filled 2022-11-20: qty 2

## 2022-11-20 MED ORDER — FENTANYL CITRATE (PF) 100 MCG/2ML IJ SOLN: 25.0000 ug | INTRAMUSCULAR | Status: DC | PRN

## 2022-11-20 MED ORDER — FENTANYL CITRATE (PF) 100 MCG/2ML IJ SOLN
INTRAMUSCULAR | Status: DC | PRN
  Administered 2022-11-20 (×2): 50 ug via INTRAVENOUS

## 2022-11-20 MED ORDER — TRANEXAMIC ACID-NACL 1000-0.7 MG/100ML-% IV SOLN
INTRAVENOUS | Status: AC
  Filled 2022-11-20: qty 100

## 2022-11-20 MED ORDER — PROPOFOL 500 MG/50ML IV EMUL
INTRAVENOUS | Status: DC | PRN
  Administered 2022-11-20: 50 ug/kg/min via INTRAVENOUS

## 2022-11-20 MED ORDER — VANCOMYCIN HCL 1000 MG IV SOLR
INTRAVENOUS | Status: AC
  Filled 2022-11-20: qty 20

## 2022-11-20 MED ORDER — ALUM & MAG HYDROXIDE-SIMETH 200-200-20 MG/5ML PO SUSP: 30.0000 mL | ORAL | Status: DC | PRN

## 2022-11-20 MED ORDER — ACETAMINOPHEN 500 MG PO TABS
1000.0000 mg | ORAL_TABLET | Freq: Three times a day (TID) | ORAL | Status: DC
  Administered 2022-11-20 – 2022-11-21 (×2): 1000 mg via ORAL
  Filled 2022-11-20 (×2): qty 2

## 2022-11-20 MED ORDER — ONDANSETRON HCL 4 MG/2ML IJ SOLN
INTRAMUSCULAR | Status: DC | PRN
  Administered 2022-11-20: 4 mg via INTRAVENOUS

## 2022-11-20 MED ORDER — ONDANSETRON HCL 4 MG/2ML IJ SOLN: 4.0000 mg | Freq: Once | INTRAMUSCULAR | Status: DC | PRN

## 2022-11-20 MED ORDER — CEFAZOLIN SODIUM-DEXTROSE 2-4 GM/100ML-% IV SOLN
2.0000 g | Freq: Three times a day (TID) | INTRAVENOUS | Status: AC
  Administered 2022-11-20 – 2022-11-21 (×3): 2 g via INTRAVENOUS
  Filled 2022-11-20 (×3): qty 100

## 2022-11-20 MED ORDER — ACETAMINOPHEN 10 MG/ML IV SOLN
INTRAVENOUS | Status: AC
  Filled 2022-11-20: qty 100

## 2022-11-20 MED ORDER — EPHEDRINE SULFATE (PRESSORS) 50 MG/ML IJ SOLN
INTRAMUSCULAR | Status: DC | PRN
  Administered 2022-11-20 (×2): 5 mg via INTRAVENOUS
  Administered 2022-11-20: 10 mg via INTRAVENOUS

## 2022-11-20 MED ORDER — VITAMIN B-12 1000 MCG PO TABS
2000.0000 ug | ORAL_TABLET | Freq: Every day | ORAL | Status: DC
  Administered 2022-11-20: 2000 ug via ORAL
  Filled 2022-11-20: qty 2

## 2022-11-20 MED ORDER — CEFAZOLIN SODIUM-DEXTROSE 2-4 GM/100ML-% IV SOLN
INTRAVENOUS | Status: AC
  Filled 2022-11-20: qty 100

## 2022-11-20 MED ORDER — METOPROLOL SUCCINATE ER 25 MG PO TB24
12.5000 mg | ORAL_TABLET | Freq: Two times a day (BID) | ORAL | Status: DC
  Administered 2022-11-21: 12.5 mg via ORAL
  Filled 2022-11-20 (×2): qty 1

## 2022-11-20 MED ORDER — SODIUM CHLORIDE 0.9 % IV BOLUS
500.0000 mL | Freq: Once | INTRAVENOUS | Status: AC
  Administered 2022-11-20: 500 mL via INTRAVENOUS

## 2022-11-20 MED ORDER — BUPIVACAINE LIPOSOME 1.3 % IJ SUSP
INTRAMUSCULAR | Status: AC
  Filled 2022-11-20: qty 10

## 2022-11-20 MED ORDER — ONDANSETRON 4 MG PO TBDP: 4.0000 mg | ORAL_TABLET | Freq: Three times a day (TID) | ORAL | Status: DC | PRN

## 2022-11-20 MED ORDER — FLUTICASONE PROPIONATE 50 MCG/ACT NA SUSP: 1.0000 | Freq: Every day | NASAL | Status: DC | PRN

## 2022-11-20 MED ORDER — MENTHOL 3 MG MT LOZG: 1.0000 | LOZENGE | OROMUCOSAL | Status: DC | PRN

## 2022-11-20 MED ORDER — ACETAMINOPHEN 10 MG/ML IV SOLN
INTRAVENOUS | Status: DC | PRN
  Administered 2022-11-20: 1000 mg via INTRAVENOUS

## 2022-11-20 MED ORDER — SENNOSIDES-DOCUSATE SODIUM 8.6-50 MG PO TABS: 1.0000 | ORAL_TABLET | Freq: Every evening | ORAL | Status: DC | PRN

## 2022-11-20 MED ORDER — VANCOMYCIN HCL IN DEXTROSE 1-5 GM/200ML-% IV SOLN
1000.0000 mg | Freq: Two times a day (BID) | INTRAVENOUS | Status: DC
  Administered 2022-11-20: 1000 mg via INTRAVENOUS
  Filled 2022-11-20 (×2): qty 200

## 2022-11-20 MED ORDER — SODIUM CHLORIDE 0.9 % IR SOLN
Status: DC | PRN
  Administered 2022-11-20: 502 mL

## 2022-11-20 MED ORDER — GABAPENTIN 100 MG PO CAPS
100.0000 mg | ORAL_CAPSULE | Freq: Two times a day (BID) | ORAL | Status: DC
  Administered 2022-11-20 – 2022-11-21 (×2): 100 mg via ORAL
  Filled 2022-11-20 (×2): qty 1

## 2022-11-20 MED ORDER — METFORMIN HCL 500 MG PO TABS: 500.0000 mg | ORAL_TABLET | Freq: Two times a day (BID) | ORAL | Status: DC

## 2022-11-20 MED ORDER — VANCOMYCIN HCL IN DEXTROSE 1-5 GM/200ML-% IV SOLN
1000.0000 mg | Freq: Once | INTRAVENOUS | Status: AC
  Administered 2022-11-20: 1000 mg via INTRAVENOUS

## 2022-11-20 MED ORDER — SUGAMMADEX SODIUM 200 MG/2ML IV SOLN
INTRAVENOUS | Status: DC | PRN
  Administered 2022-11-20: 200 mg via INTRAVENOUS

## 2022-11-20 MED ORDER — NAPROXEN 250 MG PO TABS
250.0000 mg | ORAL_TABLET | Freq: Every day | ORAL | Status: DC
  Administered 2022-11-21: 250 mg via ORAL
  Filled 2022-11-20: qty 1

## 2022-11-20 MED ORDER — PROPOFOL 10 MG/ML IV BOLUS
INTRAVENOUS | Status: DC | PRN
  Administered 2022-11-20: 130 mg via INTRAVENOUS

## 2022-11-20 MED ORDER — CHLORHEXIDINE GLUCONATE 0.12 % MT SOLN
OROMUCOSAL | Status: AC
  Filled 2022-11-20: qty 15

## 2022-11-20 MED ORDER — ONDANSETRON HCL 4 MG PO TABS: 4.0000 mg | ORAL_TABLET | Freq: Four times a day (QID) | ORAL | Status: DC | PRN

## 2022-11-20 MED ORDER — METOCLOPRAMIDE HCL 5 MG/ML IJ SOLN: 5.0000 mg | Freq: Three times a day (TID) | INTRAMUSCULAR | Status: DC | PRN

## 2022-11-20 MED ORDER — VANCOMYCIN HCL IN DEXTROSE 1-5 GM/200ML-% IV SOLN
INTRAVENOUS | Status: AC
  Filled 2022-11-20: qty 200

## 2022-11-20 MED ORDER — LIDOCAINE HCL (CARDIAC) PF 100 MG/5ML IV SOSY
PREFILLED_SYRINGE | INTRAVENOUS | Status: DC | PRN
  Administered 2022-11-20: 80 mg via INTRAVENOUS

## 2022-11-20 MED ORDER — MIDAZOLAM HCL 2 MG/2ML IJ SOLN
INTRAMUSCULAR | Status: AC
  Administered 2022-11-20: 1 mg
  Filled 2022-11-20: qty 2

## 2022-11-20 MED ORDER — METOCLOPRAMIDE HCL 5 MG PO TABS: 5.0000 mg | ORAL_TABLET | Freq: Three times a day (TID) | ORAL | Status: DC | PRN

## 2022-11-20 MED ORDER — VANCOMYCIN HCL 1000 MG IV SOLR
INTRAVENOUS | Status: DC | PRN
  Administered 2022-11-20: 1000 mg via TOPICAL

## 2022-11-20 MED ORDER — TRANEXAMIC ACID-NACL 1000-0.7 MG/100ML-% IV SOLN: 1000.0000 mg | Freq: Once | INTRAVENOUS | Status: AC

## 2022-11-20 MED ORDER — MAGNESIUM GLUCONATE 500 MG PO TABS
500.0000 mg | ORAL_TABLET | Freq: Every day | ORAL | Status: DC
  Administered 2022-11-20: 500 mg via ORAL
  Filled 2022-11-20 (×2): qty 1

## 2022-11-20 MED ORDER — MIDAZOLAM HCL 2 MG/2ML IJ SOLN: 0.5000 mg | Freq: Once | INTRAMUSCULAR | Status: DC

## 2022-11-20 MED ORDER — PHENYLEPHRINE HCL-NACL 20-0.9 MG/250ML-% IV SOLN
INTRAVENOUS | Status: DC | PRN
  Administered 2022-11-20: 20 ug/min via INTRAVENOUS

## 2022-11-20 MED ORDER — BUPIVACAINE HCL (PF) 0.5 % IJ SOLN
INTRAMUSCULAR | Status: DC | PRN
  Administered 2022-11-20: 10 mL via PERINEURAL

## 2022-11-20 MED ORDER — VITAMIN D 25 MCG (1000 UNIT) PO TABS
4000.0000 [IU] | ORAL_TABLET | Freq: Every day | ORAL | Status: DC
  Administered 2022-11-20: 4000 [IU] via ORAL
  Filled 2022-11-20: qty 4

## 2022-11-20 MED ORDER — APIXABAN 2.5 MG PO TABS
2.5000 mg | ORAL_TABLET | Freq: Two times a day (BID) | ORAL | Status: DC
  Administered 2022-11-21: 2.5 mg via ORAL
  Filled 2022-11-20: qty 1

## 2022-11-20 MED ORDER — PHENYLEPHRINE HCL (PRESSORS) 10 MG/ML IV SOLN
INTRAVENOUS | Status: DC | PRN
  Administered 2022-11-20: 160 ug via INTRAVENOUS
  Administered 2022-11-20: 80 ug via INTRAVENOUS

## 2022-11-20 SURGICAL SUPPLY — 86 items
AUG BASEPLATE 15DEG 25 WEDGE (Joint) ×1 IMPLANT
AUGMENT BASEPLATE 15DEG 25 WDG (Joint) IMPLANT
BIT DRILL 3.2 PERIPHERAL SCREW (BIT) IMPLANT
BLADE SAGITTAL WIDE XTHICK NO (BLADE) ×1 IMPLANT
CHLORAPREP W/TINT 26 (MISCELLANEOUS) ×1 IMPLANT
CNTNR URN SCR LID CUP LEK RST (MISCELLANEOUS) ×1 IMPLANT
CONT SPEC 4OZ STRL OR WHT (MISCELLANEOUS) ×1
COOLER POLAR GLACIER W/PUMP (MISCELLANEOUS) ×1 IMPLANT
COVER BACK TABLE REUSABLE LG (DRAPES) ×1 IMPLANT
DERMABOND ADVANCED .7 DNX12 (GAUZE/BANDAGES/DRESSINGS) ×1 IMPLANT
DRAPE 3/4 80X56 (DRAPES) ×2 IMPLANT
DRAPE INCISE IOBAN 66X45 STRL (DRAPES) ×2 IMPLANT
DRAPE U-SHAPE 47X51 STRL (DRAPES) ×1 IMPLANT
DRSG OPSITE POSTOP 3X4 (GAUZE/BANDAGES/DRESSINGS) ×1 IMPLANT
DRSG OPSITE POSTOP 4X6 (GAUZE/BANDAGES/DRESSINGS) ×1 IMPLANT
DRSG OPSITE POSTOP 4X8 (GAUZE/BANDAGES/DRESSINGS) ×1 IMPLANT
DRSG TEGADERM 4X10 (GAUZE/BANDAGES/DRESSINGS) ×3 IMPLANT
ELECT REM PT RETURN 9FT ADLT (ELECTROSURGICAL) ×1
ELECTRODE REM PT RTRN 9FT ADLT (ELECTROSURGICAL) ×1 IMPLANT
GAUZE XEROFORM 1X8 LF (GAUZE/BANDAGES/DRESSINGS) ×1 IMPLANT
GLENOSPHERE REV SHOULDER 36 (Joint) IMPLANT
GLOVE BIOGEL PI IND STRL 8 (GLOVE) ×2 IMPLANT
GLOVE PI ULTRA LF STRL 7.5 (GLOVE) ×2 IMPLANT
GLOVE SURG ORTHO 8.0 STRL STRW (GLOVE) ×2 IMPLANT
GLOVE SURG SYN 8.0 (GLOVE) ×1 IMPLANT
GLOVE SURG SYN 8.0 PF PI (GLOVE) ×1 IMPLANT
GOWN STRL REUS W/ TWL LRG LVL3 (GOWN DISPOSABLE) ×2 IMPLANT
GOWN STRL REUS W/ TWL XL LVL3 (GOWN DISPOSABLE) ×1 IMPLANT
GOWN STRL REUS W/TWL LRG LVL3 (GOWN DISPOSABLE) ×2
GOWN STRL REUS W/TWL XL LVL3 (GOWN DISPOSABLE) ×1
GUIDE GLENOID PATIENT REVERSED (MISCELLANEOUS) IMPLANT
GUIDE PIN 3X75 SHOULDER (PIN) ×1
GUIDEPIN HUM 3X100 (PIN) IMPLANT
GUIDEWIRE GLENOID 2.5X220 (WIRE) IMPLANT
HEMOVAC 400CC 10FR (MISCELLANEOUS) ×1 IMPLANT
HOOD PEEL AWAY T7 (MISCELLANEOUS) ×3 IMPLANT
IMPL REVERSE SHOULDER 0X3.5 (Shoulder) IMPLANT
IMPLANT REVERSE SHOULDER 0X3.5 (Shoulder) ×1 IMPLANT
INSERT HUMERAL 36X6MM 12.5DEG (Insert) IMPLANT
INSERT REV KIT SHOULDER 9X36 (Joint) IMPLANT
IV NS IRRIG 3000ML ARTHROMATIC (IV SOLUTION) IMPLANT
KIT STABILIZATION SHOULDER (MISCELLANEOUS) ×1 IMPLANT
MANIFOLD NEPTUNE II (INSTRUMENTS) ×1 IMPLANT
MASK FACE SPIDER DISP (MASK) ×1 IMPLANT
MAT ABSORB  FLUID 56X50 GRAY (MISCELLANEOUS) ×2
MAT ABSORB FLUID 56X50 GRAY (MISCELLANEOUS) ×2 IMPLANT
NDL REVERSE CUT 1/2 CRC (NEEDLE) ×1 IMPLANT
NDL SPNL 20GX3.5 QUINCKE YW (NEEDLE) ×1 IMPLANT
NEEDLE REVERSE CUT 1/2 CRC (NEEDLE) IMPLANT
NEEDLE SPNL 20GX3.5 QUINCKE YW (NEEDLE) ×1 IMPLANT
NS IRRIG 1000ML POUR BTL (IV SOLUTION) ×1 IMPLANT
NS IRRIG 500ML POUR BTL (IV SOLUTION) IMPLANT
PACK ARTHROSCOPY SHOULDER (MISCELLANEOUS) ×1 IMPLANT
PAD WRAPON POLAR SHDR XLG (MISCELLANEOUS) ×1 IMPLANT
PIN GUIDE 3X75 SHOULDER (PIN) IMPLANT
PULSAVAC PLUS IRRIG FAN TIP (DISPOSABLE) ×1
SCREW 5.0X18 (Screw) IMPLANT
SCREW 5.5X22 (Screw) IMPLANT
SCREW BONE 6.5 OD 30 NON BIO (Screw) IMPLANT
SCREW PERIPHERAL 5.0X34 (Screw) IMPLANT
SLING ULTRA II LG (MISCELLANEOUS) ×1 IMPLANT
SLING ULTRA II M (MISCELLANEOUS) ×1 IMPLANT
SPONGE T-LAP 18X18 ~~LOC~~+RFID (SPONGE) ×5 IMPLANT
STEM HUMERAL SZ4BX104 132.5D (Joint) IMPLANT
STRAP SAFETY 5IN WIDE (MISCELLANEOUS) ×2 IMPLANT
SUT ETHIBOND 5-0 MS/4 CCS GRN (SUTURE) ×1
SUT FIBERWIRE #2 38 BLUE 1/2 (SUTURE) ×3
SUT MNCRL AB 4-0 PS2 18 (SUTURE) IMPLANT
SUT PROLENE 6 0 P 1 18 (SUTURE) ×1 IMPLANT
SUT TICRON 2-0 30IN 311381 (SUTURE) ×3 IMPLANT
SUT VIC AB 0 CT1 36 (SUTURE) ×1 IMPLANT
SUT VIC AB 2-0 CT2 27 (SUTURE) ×2 IMPLANT
SUT XBRAID 1.4 BLK/WHT (SUTURE) IMPLANT
SUT XBRAID 1.4 BLUE (SUTURE) IMPLANT
SUT XBRAID 1.4 WHITE/BLUE (SUTURE) IMPLANT
SUT XBRAID 2 BLACK/BLUE (SUTURE) IMPLANT
SUTURE ETHBND 5-0 MS/4 CCS GRN (SUTURE) IMPLANT
SUTURE FIBERWR #2 38 BLUE 1/2 (SUTURE) ×4 IMPLANT
SYR 20ML LL LF (SYRINGE) ×1 IMPLANT
SYR 30ML LL (SYRINGE) ×2 IMPLANT
TIP FAN IRRIG PULSAVAC PLUS (DISPOSABLE) ×1 IMPLANT
TRAP FLUID SMOKE EVACUATOR (MISCELLANEOUS) ×1 IMPLANT
TRAY FOLEY SLVR 16FR LF STAT (SET/KITS/TRAYS/PACK) IMPLANT
WATER STERILE IRR 1000ML POUR (IV SOLUTION) IMPLANT
WATER STERILE IRR 500ML POUR (IV SOLUTION) ×1 IMPLANT
WRAPON POLAR PAD SHDR XLG (MISCELLANEOUS) ×1

## 2022-11-20 NOTE — Discharge Instructions (Signed)
Nancy Mulligan, MD  [facility]  Phone: [phone]  Fax: [phone]   Discharge Instructions after Reverse Shoulder Replacement    1. Activity/Sling: You are to be non-weight bearing on operative extremity. A sling/shoulder immobilizer has been provided for you. Only remove the sling to perform elbow, wrist, and hand RoM exercises and hygiene/dressing. Active reaching and lifting are not permitted. You will be given further instructions on sling use at your first physical therapy visit and postoperative visit with Dr. Posey Pronto.   2. Dressings: Dressing may be removed at 1st physical therapy visit (~3-4 days after surgery). Afterwards, you may either leave open to air (if no drainage) or cover with dry, sterile dressing. If you have steri-strips on your wound, please do not remove them. They will fall off on their own. You may shower 5 days after surgery. Please pat incision dry. Do not rub or place any shear forces across incision. If there is drainage or any opening of incision after 5 days, please notify our offices immediately.    3. Driving:  Plan on not driving for six weeks. Please note that you are advised NOT to drive while taking narcotic pain medications as you may be impaired and unsafe to drive.   4. Medications:  - You have been provided a prescription for narcotic pain medicine (usually oxycodone). After surgery, take 1-2 narcotic tablets every 4 hours if needed for severe pain. Please start this as soon as you begin to start having pain (if you received a nerve block, start taking as soon as this wears off).  - A prescription for anti-nausea medication will be provided in case the narcotic medicine causes nausea - take 1 tablet every 6 hours only if nauseated.  - Blood clot prevention: Take Eliquis 2.74m twice daily for 2 weeks. Then take enteric coated aspirin 325 mg once daily for 4 weeks  -Take tylenol 10027m(2 Extra strength or 3 regular strength tablets) every 8 hours for  pain. This will reduce the amount of narcotic medication needed. May stop tylenol when you are having minimal pain. - Take a stool softener (Colace, Dulcolax or Senakot) if you are using narcotic pain medications to help with constipation that is associated with narcotic use. - DO NOT take ANY nonsteroidal anti-inflammatory pain medications: Advil, Motrin, Ibuprofen, Aleve, Naproxen, or Naprosyn.   If you are taking prescription medication for anxiety, depression, insomnia, muscle spasm, chronic pain, or for attention deficit disorder you are advised that you are at a higher risk of adverse effects with use of narcotics post-op, including narcotic addiction/dependence, depressed breathing, death. If you use non-prescribed substances: alcohol, marijuana, cocaine, heroin, methamphetamines, etc., you are at a higher risk of adverse effects with use of narcotics post-op, including narcotic addiction/dependence, depressed breathing, death. You are advised that taking > 50 morphine milligram equivalents (MME) of narcotic pain medication per day results in twice the risk of overdose or death. For your prescription provided: oxycodone 5 mg - taking more than 6 tablets per day after the first few days of surgery.   5. Physical Therapy: 1-2 times per week for ~12 weeks. Therapy typically starts on post operative Day 3 or 4. You have been provided an order for physical therapy. The therapist will provide home exercises. Please contact our offices if this appointment has not been scheduled.    6. Work: May do light duty/desk job in approximately 2 weeks when off of narcotics, pain is well-controlled, and swelling has decreased if able to  function with one arm in sling. Full work may take 6 weeks if light motions and function of both arms is required. Lifting jobs may require 12 weeks.   7. Post-Op Appointments: Your first post-op appointment will be with Dr. Posey Pronto in approximately 2 weeks time.    If you find  that they have not been scheduled please call the Orthopaedic Appointment front desk at [phone].                               Nancy Mulligan, MD [facility] Phone: [phone] Fax: [phone]   REVERSE SHOULDER ARTHROPLASTY REHAB GUIDELINES   These guidelines should be tailored to individual patients based on their rehab goals, age, precautions, quality of repair, etc.  Progression should be based on patient progress and approval by the referring physician.  PHASE 1 - Day 1 through Week 2  GENERAL GUIDELINES AND PRECAUTIONS Sling wear 24/7 except during grooming and home exercises (3 to 5 times daily) Avoid shoulder extension such that the arm is posterior the frontal plane.  When patients recline, a pillow should be placed behind the upper arm and sling should be on.  They should be advised to always be able to see the elbow Avoid combined IR/ADD/EXT, such as hand behind back to prevent dislocation Avoid combined IR and ADD such as reaching across the chest to prevent dislocation No AROM No submersion in pool/water for 4 weeks No weight bearing through operative arm (as in transfers, walker use, etc.)  GOALS Maintain integrity of joint replacement; protect soft tissue healing Increase PROM for elevation to 120 and ER to 30 (will remain the goal for first 6 weeks) Optimize distal UE circulation and muscle activity (elbow, wrist and hand) Instruct in use of sling for proper fit, polar care device for ice application after HEP, signs/symptoms of infection  EXERCISES Active elbow, wrist and hand Passive forward elevation in scapular plane to 90-120 max motion; ER in scapular plane to 30 Active scapular retraction with arms resting in neutral position  CRITERIA TO PROGRESS TO PHASE 2 Low pain (less than 3/10) with shoulder PROM Healing of incision without signs of infection Clearance by MD to advance after 2 week MD check up  PHASE 2 - 2  weeks - 6 weeks  GENERAL GUIDELINES AND PRECAUTIONS Sling may be removed while at home; worn in community without abduction pillow May use arm for light activities of daily living (such as feeding, brushing teeth, dressing.) with elbow near  the side of the body  and arm in front of the body- no active lifting of the arm May submerge in water (tub, pool, [place], etc.) after 4 weeks Continue to avoid WBing through the operative arm Continue to avoid combined IR/EXT/ADD (hand behind the back) and IR/ADD  (reaching across chest) for dislocation precautions  GOALS  Achieve passive elevation to 120 and ER to 30  Low (less than 3/10) to no pain  Ability to fire all heads of the deltoid  EXERCISES May discontinue grip, and active elbow and wrist exercises since using the arm in ADL's  with sling removed around the home Continue passive elevation to 120 and ER to 30, both in scapular plane with arm supported on table top Add submaximal isometrics, pain free effort, for all functional heads of deltoid (anterior, posterior, middle)  Ensure that with posterior deltoid isometric the shoulder does not move into extension and the arm remains  anterior the frontal plane At 4 weeks:  begin to place arm in balanced position of 90 deg elevation in supine; when patient able to hold this position with ease, may begin reverse pendulums clockwise and counterclockwise  CRITERIA TO PROGRESS TO PHASE 3 Passive forward elevation in scapular plane to 120; passive ER in scapular plane to 30 Ability to fire isometrically all heads of the deltoid muscle without pain Ability to place and hold the arm in balanced position (90 deg elevation in supine)  PHASE 3 - 6 weeks to 3 months  GENERAL GUIDELINES AND PRECAUTIONS Discontinue use of sling Avoid forcing end range motion in any direction to prevent dislocation  May advance use of the arm actively in ADL's without being restricted to arm by the side of the body,  however, avoid heavy lifting and sports (forever!) May initiate functional IR behind the back gently NO UPPER BODY ERGOMETER   GOALS Optimize PROM for elevation and ER in scapular plane with realistic expectation that max  mobility for elevation is usually around 145-160 passively; ER 40 to 50 passively; functional IR to L1 Recover AROM to approach as close to PROM available as possible; may expect 135-150 deg active elevation; 30 deg active ER; active functional IR to L1 Establish dynamic stability of the shoulder with deltoid and periscapular muscle gradual strengthening  EXERCISES Forward elevation in scapular plane active progression: supine to incline, to vertical; short to long lever arm Balanced position long lever arm AROM Active ER/IR with arm at side Scapular retraction with light band resistance Functional IR with hand slide up back - very gentle and gradual NO UPPER BODY ERGOMETER     CRITERIA TO PROGRESS TO PHASE 4  AROM equals/approaches PROM with good mechanics for elevation   No pain  Higher level demand on shoulder than ADL functions   PHASE 4 12 months and beyond  GENERAL GUIDELINES AND PRECAUTIONS No heavy lifting and no overhead sports No heavy pushing activity Gradually increase strength of deltoid and scapular stabilizers; also the rotator cuff if present with weights not to exceed 5 lbs NO UPPER BODY ERGOMETER   GOALS  Optimize functional use of the operative UE to meet the desired demands  Gradual increase in deltoid, scapular muscle, and rotator cuff strength  Pain free functional activities   EXERCISES Add light hand weights for deltoid up to and not to exceed 3 lbs for anterior and posterior with long arm lift against gravity; elbow bent to 90 deg for abduction in scapular plane Theraband progression for extension to hip with scapular depression/retraction Theraband progression for serratus anterior punches in supine; avoid wall, incline or prone  pressups for serratus anterior End range stretching gently without forceful overpressure in all planes (elevation in scapular plane, ER in scapular plane, functional IR) with stretching done for life as part of a daily routine NO UPPER BODY ERGOMETER     CRITERIA FOR DISCHARGE FROM SKILLED PHYSICAL THERAPY  Pain free AROM for shoulder elevation (expect around 135-150)  Functional strength for all ADL's, work tasks, and hobbies approved by Psychologist, sport and exercise  Independence with home maintenance program   NOTES: 1. With proper exercise, motion, strength, and function continue to improve even after one year. 2. The complication rate after surgery is 5 - 8%. Complications include infection, fracture, heterotopic bone formation, nerve injury, instability, rotator cuff tear, and tuberosity nonunion. Please look for clinical signs, unusual symptoms, or lack of progress with therapy and report those to Dr. Posey Pronto. Prefer more  communication than less.  3. The therapy plan above only serves as a guide. Please be aware of specific individualized patient instructions as written on the prescription or through discussions with the surgeon. 4. Please call Dr. Posey Pronto if you have any specific questions or concerns [phone]

## 2022-11-20 NOTE — Op Note (Signed)
Operative Note    SURGERY DATE: 11/20/2022   PRE-OP DIAGNOSIS:  1. Right shoulder glenohumeral osteoarthritis   POST-OP DIAGNOSIS:  1. Right shoulder glenohumeral osteoarthritis  PROCEDURES:  1. Right reverse total shoulder arthroplasty 2. Right biceps tenodesis   SURGEON: Cato Mulligan, MD   ASSISTANT: Anitra Lauth, PA; Linward Natal, PA-S   ANESTHESIA: Gen + Exparel interscalene block   ESTIMATED BLOOD LOSS: 200cc   TOTAL IV FLUIDS: See anesthesia record  IMPLANTS: Tornier: Perform plus reversed full wedge augment baseplate 15 degree with 6.5 x 47m central screw; superior compression screw, posterior locking screw, inferior locking screw, and anterior locking screw to glenoid baseplate; 36 mm standard glenosphere; size 4B short humeral stem; High eccentricity reversed tray; reversed insert +9 mm   INDICATION(S): Nancy Peasleyis a 82y.o. female with over 1 year of shoulder pain.  Conservative measures including medications, physical therapy, and cortisone injections have not provided adequate relief.  Preoperative imaging was consistent with severe glenohumeral arthritis.  After discussion of risks, benefits, and alternatives to surgery, the patient elected to proceed with reverse shoulder arthroplasty and biceps tenodesis.   OPERATIVE FINDINGS: Severe glenohumeral arthritis; significant biceps tendinopathy; low bone density   OPERATIVE REPORT:   I identified Nancy Brazilin the pre-operative holding area. Informed consent was obtained and the surgical site was marked. I reviewed the risks and benefits of the proposed surgical intervention and the patient wished to proceed. An Exparel interscalene block was administered by the Anesthesia team. The patient was transferred to the operative suite and general anesthesia was administered. The patient was placed in the beach chair position with the head of the bed elevated approximately 45 degrees. All down side pressure points  were appropriately padded. Pre-op exam under anesthesia confirmed some stiffness and crepitus. Appropriate IV antibiotics were administered. Tranexamic acid was also administered. The extremity was then prepped and draped in standard fashion. A time out was performed confirming the correct extremity, correct patient, and correct procedure.   We used the standard deltopectoral incision from the coracoid to ~10cm distal. We found the cephalic vein and took it laterally.  We opened the deltopectoral interval widely and placed retractors under the CA ligament in the subacromial space and under the deltoid tendon at its insertion. We then abducted and internally rotated the arm and released the underlying bursa between these retractors, taking care not to damage the circumflex branch of the axillary nerve.   Next, we brought the arm back in adduction at slight forward flexion with external rotation. We opened the clavipectoral fascia lateral to the conjoint tendon. We gently palpated the axillary nerve and verified its position and continuity on both sides of the humerus with a Tug test. Note, this test was repeated multiple times during the procedure for nerve localization and confirmed to be intact at the end of the case. We then cauterized the anterior humeral circumflex ("Three sisters") vessels. The arm was then internally rotated, we cut the falciform ligament at approximately 1 cm of the upper portion of the pectoralis major insertion. Next we unroofed the bicipital groove. We proceeded with a soft tissue biceps tenodesis given the pathology of the tendon.  After opening the biceps tendon sheath all the way to the supraglenoid tubercle, we performed a biceps tenodesis with two #2 TiCron sutures to the upper border of the pectoralis major. The proximal portion of the tendon was excised.   Next, the remnants of the anterior capsule and subscapularis were peeled  off of the lesser tuberosity. We released the  inferior capsule from the humerus all the way to the posterior band of the inferior glenohumeral ligament. When this was complete we gently dislocated the shoulder up into the wound. We removed any osteophytes and made our cut with the appropriate inclination in 20 degrees of retroversion.   We then turned our attention to the glenoid. The proximal humerus was retracted posteriorly. The anterior capsule was dissected free from the retracted subscapularis. The anterior capsule was then excised, exposing the anterior glenoid. We then grasped the labrum and removed it circumferentially. During the glenoid exposure, the axillary nerve was protected the entire time.     A patient-specific guide was used to drill the central guidepin. The angled reamer was used to gently ream the augment side superiorly/posteriorly.  The full wedge glenoid trial was placed with appropriate fit.  Osteophytes were carefully removed from the glenoid.  Standard instrumentation was carried out.  The baseplate with 6.5 mm central screw was placed.  This allowed for excellent compression of the baseplate to the glenoid with appropriate seating. Next the compression screw was drilled and placed.  The remaining locking screws were also placed. The peripheral reamer was used to ensure appropriate glenosphere seating.  Next, the glenosphere was impacted into place.  The central screw of the glenosphere was tightened.   We then turned our attention back to the humerus.  The lesser tuberosity was noted to be fractured off and was removed.  The humeral canal was sequentially broached to the appropriate size.  The trial poly was placed and humerus was reduced.  The shoulder was trialed and noted to have satisfactory stability, motion, and deltoid tension with these implants.  The trial implants were removed.  Upon trialing, it was noted that the greater tuberosity had also fractured off.  Therefore four #5 Ethibond sutures were placed at the bone  tendon junction.  The humeral canal was pulse lavaged. Two drill holes were placed lateral to the bicipital groove approximately 2 cm distal to the superior aspect of the humeral canal. FiberWire sutures were passed through these holes for later vertical sutures for greater tuberosity repair.  Decision was made not to repair subscapularis given that there was no appropriate lesser tuberosity for reattachment.  Next, the humeral stem was placed with the appropriate retroversion. Stability was confirmed.  Humeral tray and poly insert were placed.  The humerus was reduced and motion, tension, and stability were satisfactory.  The 4 Ethibond sutures were passed around the humeral tray, and with the greater tuberosity placed in an appropriate position on the lateral aspect of the implant, sutures were tied.  Vertical sutures were also passed through the greater tuberosity fragment/supraspinatus tendon and tied down, further securing the repair.  Greater tuberosity and arm were noted to move together with external/internal rotation.  A Hemovac drain was placed.   We again verified the tension on the axillary nerve, appropriate range of motion, stability of the implant, and security of the greater tuberosity repair. We closed the deltopectoral interval with a running, 0-Vicryl suture. The skin was closed with 2-0 Vicryl and 4-0 Monocryl with Dermabond. Xeroform and Honeycomb dressing was applied. A PolarCare unit and sling were placed. Patient was extubated, transferred to a stretcher bed and to the post antesthesia care unit in stable condition.    Of note, assistance from a PA was essential to performing the surgery.  PA was present for the entire surgery.  PA assisted with  patient positioning, retraction, instrumentation, and wound closure. The surgery would have been more difficult and had longer operative time without PA assistance.    POSTOPERATIVE PLAN: The patient will be admitted with plan for discharge  home on POD#1. Operative arm to remain in sling at all times except RoM exercises and hygiene. Can perform pendulums, elbow/wrist/hand RoM exercises. Passive RoM allowed to 90 FF and 30 ER.  Given history of DVT, patient preferred Eliquis 2.5 mg twice daily x 2 weeks followed by ASA 346m x 4 weeks for DVT ppx. Plan for outpatient PT. Patient to return to clinic in ~2 weeks for post-operative appointment.

## 2022-11-20 NOTE — Consult Note (Signed)
Pharmacy Antibiotic Note  Nancy Hickman is a 82 y.o. female admitted on 11/20/2022 with surgical prophylaxis.  Pharmacy has been consulted for vancomycin dosing post surgical procedure.  Plan: --Give vancomycin 1 gram IV every 12 hours x 2 doses  Height: 4' 10"$  (147.3 cm) Weight: 73.9 kg (163 lb) IBW/kg (Calculated) : 40.9  Temp (24hrs), Avg:98.4 F (36.9 C), Min:98 F (36.7 C), Max:98.7 F (37.1 C)  No results for input(s): "WBC", "CREATININE", "LATICACIDVEN", "VANCOTROUGH", "VANCOPEAK", "VANCORANDOM", "GENTTROUGH", "GENTPEAK", "GENTRANDOM", "TOBRATROUGH", "TOBRAPEAK", "TOBRARND", "AMIKACINPEAK", "AMIKACINTROU", "AMIKACIN" in the last 168 hours.  Estimated Creatinine Clearance: 34.8 mL/min (A) (by C-G formula based on SCr of 1.1 mg/dL (H)).    Allergies  Allergen Reactions   Doxycycline Rash and Other (See Comments)    "Water blisters"   Lorcet Plus [Hydrocodone-Acetaminophen] Nausea And Vomiting and Rash   Oxycodone Rash    Excessive itching   Penicillins Nausea And Vomiting and Other (See Comments)    Vomiting to the point of dehydration.   Erythromycin Base Nausea And Vomiting   Neomycin Nausea And Vomiting   Statins Other (See Comments)    Muscle pain.    Antimicrobials this admission: cefazolin 2/13 >> 2/14 vancomycin 2/13 >> 2/14  Dose adjustments this admission: N/A  Microbiology results: N/A  Thank you for allowing pharmacy to be a part of this patient's care.  Lorin Picket 11/20/2022 3:18 PM

## 2022-11-20 NOTE — H&P (Signed)
Delayed entry. Performed ~1100.  Paper H&P to be scanned into permanent record. H&P reviewed. No significant changes noted.

## 2022-11-20 NOTE — Transfer of Care (Addendum)
Immediate Anesthesia Transfer of Care Note  Patient: Nancy Hickman  Procedure(s) Performed: Right reverse shoulder arthroplasty, biceps tenodesis (Right: Shoulder)  Patient Location: PACU  Anesthesia Type:General  Level of Consciousness: awake  Airway & Oxygen Therapy: Patient Spontanous Breathing  Post-op Assessment: Report given to RN and Post -op Vital signs reviewed and stable  Post vital signs: Reviewed and stable  Last Vitals:  Vitals Value Taken Time  BP    Temp    Pulse    Resp    SpO2      Last Pain:  Vitals:   11/20/22 1018  TempSrc: Oral         Complications: No notable events documented.

## 2022-11-20 NOTE — Anesthesia Procedure Notes (Signed)
Procedure Name: Intubation Date/Time: 11/20/2022 11:48 AM  Performed by: Lowry Bowl, CRNAPre-anesthesia Checklist: Patient identified, Emergency Drugs available, Suction available and Patient being monitored Patient Re-evaluated:Patient Re-evaluated prior to induction Oxygen Delivery Method: Circle system utilized Preoxygenation: Pre-oxygenation with 100% oxygen Induction Type: IV induction Ventilation: Mask ventilation without difficulty Laryngoscope Size: 3 and McGraph Grade View: Grade I Tube type: Oral Tube size: 6.5 mm Number of attempts: 1 Airway Equipment and Method: Stylet and Video-laryngoscopy Placement Confirmation: ETT inserted through vocal cords under direct vision, positive ETCO2 and breath sounds checked- equal and bilateral Secured at: 21 cm Tube secured with: Tape Dental Injury: Teeth and Oropharynx as per pre-operative assessment

## 2022-11-20 NOTE — Anesthesia Preprocedure Evaluation (Addendum)
Anesthesia Evaluation    Airway Mallampati: II  TM Distance: >3 FB Neck ROM: Full    Dental  (+) Edentulous Upper, Partial Lower, Chipped   Pulmonary    Pulmonary exam normal breath sounds clear to auscultation       Cardiovascular METS Rhythm:Regular Rate:Normal - Systolic murmurs    Neuro/Psych    GI/Hepatic   Endo/Other    Renal/GU      Musculoskeletal  (+) Arthritis ,    Abdominal  (+) + obese  Peds  Hematology   Anesthesia Other Findings   Reproductive/Obstetrics                              Anesthesia Physical Anesthesia Plan  ASA: 3  Anesthesia Plan: General   Post-op Pain Management: Regional block* and Ofirmev IV (intra-op)*   Induction: Intravenous  PONV Risk Score and Plan: 2 and Ondansetron, Dexamethasone and Midazolam  Airway Management Planned: Oral ETT and Video Laryngoscope Planned  Additional Equipment: None  Intra-op Plan:   Post-operative Plan: Extubation in OR  Informed Consent: I have reviewed the patients History and Physical, chart, labs and discussed the procedure including the risks, benefits and alternatives for the proposed anesthesia with the patient or authorized representative who has indicated his/her understanding and acceptance.     Dental advisory given  Plan Discussed with: CRNA and Surgeon  Anesthesia Plan Comments: (Discussed risks of anesthesia with patient, including PONV, sore throat, lip/dental/eye damage. Rare risks discussed as well, such as cardiorespiratory and neurological sequelae, and allergic reactions. Discussed the role of CRNA in patient's perioperative care. Patient understands. Discussed r/b/a of interscalene block, including elective nature. Risks discussed: - Rare: bleeding, infection, nerve damage - shortness of breath from hemidiaphragmatic paralysis - unilateral horner's syndrome - poor/non-working blocks -  reactions and toxicity to local anesthetic Patient understands and agrees. )         Anesthesia Quick Evaluation

## 2022-11-20 NOTE — Progress Notes (Signed)
PT Cancellation Note  Patient Details Name: Gineen Effinger MRN: [id number] DOB: 04/12/1941   Cancelled Treatment:    Reason Eval/Treat Not Completed: Patient not medically ready: Per conversation with nursing pt not ready/appropriate for participation with PT services at this time, currently attempting to elevate pt's BP with fluids.  Will attempt to see pt at a future date/time as medically appropriate.    Linus Salmons PT, DPT 11/20/22, 4:30 PM

## 2022-11-20 NOTE — Anesthesia Procedure Notes (Signed)
Anesthesia Regional Block: Interscalene brachial plexus block  ? ?Pre-Anesthetic Checklist: , timeout performed,  Correct Patient, Correct Site, Correct Laterality,  Correct Procedure, Correct Position, site marked,  Risks and benefits discussed,  Surgical consent,  Pre-op evaluation,  At surgeon's request and post-op pain management ? ?Laterality: Right ? ?Prep: chloraprep     ?  ?Needles:  ?Injection technique: Single-shot ? ?Needle Type: Echogenic Needle   ? ? ?Needle Length: 4cm  ?Needle Gauge: 25  ? ? ? ?Additional Needles: ? ? ?Procedures:,,,, ultrasound used (permanent image in chart),,    ?Narrative:  ?Injection made incrementally with aspirations every 5 mL. ? ?Performed by: Personally  ?Anesthesiologist: Hudsen Fei, MD ? ?Additional Notes: ?Patient's chart reviewed and they were deemed appropriate candidate for procedure, at surgeon's request. Patient educated about risks, benefits, and alternatives of the block including but not limited to: temporary or permanent nerve damage, bleeding, infection, damage to surround tissues, pneumothorax, hemidiaphragmatic paralysis, unilateral Horner's syndrome, block failure, local anesthetic toxicity. Patient expressed understanding. A formal time-out was conducted consistent with institution rules. ? ?Monitors were applied, and minimal sedation used (see nursing record). The site was prepped with skin prep and allowed to dry, and sterile gloves were used. A high frequency linear ultrasound probe with probe cover was utilized throughout. C5-7 nerve roots located and appeared anatomically normal, local anesthetic injected around them, and echogenic block needle trajectory was monitored throughout. Aspiration performed every 5ml. Lung and blood vessels were avoided. All injections were performed without resistance and free of blood and paresthesias. The patient tolerated the procedure well. ? ?Injectate: 10ml exparel + 10ml 0.5% bupivacaine  ? ? ? ?

## 2022-11-21 ENCOUNTER — Encounter: Payer: Self-pay | Admitting: Orthopedic Surgery

## 2022-11-21 DIAGNOSIS — M19011 Primary osteoarthritis, right shoulder: Secondary | ICD-10-CM | POA: Diagnosis not present

## 2022-11-21 LAB — CBC
HCT: 30.8 % — ABNORMAL LOW (ref 36.0–46.0)
Hemoglobin: 10.3 g/dL — ABNORMAL LOW (ref 12.0–15.0)
MCH: 31 pg (ref 26.0–34.0)
MCHC: 33.4 g/dL (ref 30.0–36.0)
MCV: 92.8 fL (ref 80.0–100.0)
Platelets: 283 10*3/uL (ref 150–400)
RBC: 3.32 MIL/uL — ABNORMAL LOW (ref 3.87–5.11)
RDW: 14.7 % (ref 11.5–15.5)
WBC: 10.1 10*3/uL (ref 4.0–10.5)
nRBC: 0 % (ref 0.0–0.2)

## 2022-11-21 LAB — BASIC METABOLIC PANEL
Anion gap: 9 (ref 5–15)
BUN: 17 mg/dL (ref 8–23)
CO2: 20 mmol/L — ABNORMAL LOW (ref 22–32)
Calcium: 8.5 mg/dL — ABNORMAL LOW (ref 8.9–10.3)
Chloride: 108 mmol/L (ref 98–111)
Creatinine, Ser: 1.07 mg/dL — ABNORMAL HIGH (ref 0.44–1.00)
GFR, Estimated: 53 mL/min — ABNORMAL LOW (ref >60)
Glucose, Bld: 160 mg/dL — ABNORMAL HIGH (ref 70–99)
Potassium: 4.1 mmol/L (ref 3.5–5.1)
Sodium: 137 mmol/L (ref 135–145)

## 2022-11-21 MED ORDER — ONDANSETRON HCL 4 MG PO TABS: 4.0000 mg | ORAL_TABLET | Freq: Four times a day (QID) | ORAL | 0 refills | Status: AC | PRN

## 2022-11-21 MED ORDER — TRAMADOL HCL 50 MG PO TABS: 50.0000 mg | ORAL_TABLET | Freq: Four times a day (QID) | ORAL | 0 refills | Status: DC

## 2022-11-21 MED ORDER — OXYCODONE HCL 5 MG PO TABS: 5.0000 mg | ORAL_TABLET | ORAL | 0 refills | Status: DC | PRN

## 2022-11-21 MED ORDER — TRAMADOL HCL 50 MG PO TABS: 50.0000 mg | ORAL_TABLET | Freq: Four times a day (QID) | ORAL | Status: DC

## 2022-11-21 MED ORDER — VOLTAREN ARTHRITIS PAIN 1 % EX GEL: 2.0000 g | Freq: Four times a day (QID) | CUTANEOUS | 1 refills | Status: AC | PRN

## 2022-11-21 MED ORDER — APIXABAN 2.5 MG PO TABS: 2.5000 mg | ORAL_TABLET | Freq: Two times a day (BID) | ORAL | 0 refills | Status: DC

## 2022-11-21 NOTE — Plan of Care (Signed)
  Problem: Education: Goal: Knowledge of the prescribed therapeutic regimen will improve Outcome: Adequate for Discharge Goal: Understanding of activity limitations/precautions following surgery will improve Outcome: Adequate for Discharge Goal: Individualized Educational Video(s) Outcome: Adequate for Discharge   Problem: Activity: Goal: Ability to tolerate increased activity will improve Outcome: Adequate for Discharge   Problem: Pain Management: Goal: Pain level will decrease with appropriate interventions Outcome: Adequate for Discharge   Problem: Education: Goal: Knowledge of General Education information will improve Description: Including pain rating scale, medication(s)/side effects and non-pharmacologic comfort measures Outcome: Adequate for Discharge   Problem: Health Behavior/Discharge Planning: Goal: Ability to manage health-related needs will improve Outcome: Adequate for Discharge   Problem: Clinical Measurements: Goal: Ability to maintain clinical measurements within normal limits will improve Outcome: Adequate for Discharge Goal: Will remain free from infection Outcome: Adequate for Discharge Goal: Diagnostic test results will improve Outcome: Adequate for Discharge Goal: Respiratory complications will improve Outcome: Adequate for Discharge Goal: Cardiovascular complication will be avoided Outcome: Adequate for Discharge   Problem: Activity: Goal: Risk for activity intolerance will decrease Outcome: Adequate for Discharge   Problem: Nutrition: Goal: Adequate nutrition will be maintained Outcome: Adequate for Discharge   Problem: Coping: Goal: Level of anxiety will decrease Outcome: Adequate for Discharge   Problem: Elimination: Goal: Will not experience complications related to bowel motility Outcome: Adequate for Discharge Goal: Will not experience complications related to urinary retention Outcome: Adequate for Discharge   Problem: Pain  Managment: Goal: General experience of comfort will improve Outcome: Adequate for Discharge   Problem: Safety: Goal: Ability to remain free from injury will improve Outcome: Adequate for Discharge   Problem: Skin Integrity: Goal: Risk for impaired skin integrity will decrease Outcome: Adequate for Discharge

## 2022-11-21 NOTE — TOC CM/SW Note (Signed)
Patient has orders to discharge home today. Outpatient PT recommended. She already has an appointment scheduled at [facility] in [place] for 2/16. No DME recommendations. No TOC needs identified. CSW signing off.  Dayton Scrape, Nancy Hickman

## 2022-11-21 NOTE — Evaluation (Signed)
Occupational Therapy Evaluation Patient Details Name: Nancy Hickman MRN: [id number] DOB: 06/30/1941 Today's Date: 11/21/2022   History of Present Illness Pt is an 82 yo F diagnosed with right shoulder glenohumeral osteoarthritis and is s/p elective right reverse total shoulder arthroplasty and biceps tenodesis.  PMH includes DM, CKD, and MI.   Clinical Impression   Upon entering the room, pt seated in recliner chair and agreeable to OT intervention. Pt agreeable to OT intervention and reports no c/o pain during session. Pt educated on NWB status, self care while maintaining precautions, polar care, and sling use. OT offered to wait until family present for education but pt declined. Pt needing mod A for UB dressing and assist to don sling and polar care. Pt able to thread clothing onto B LEs and stands with min guard to pull over B hips. Handout provided as reminder for home and will have daughter staying with her to assist. All needs within reach and all education completed. OT to complete order.     Recommendations for follow up therapy are one component of a multi-disciplinary discharge planning process, led by the attending physician.  Recommendations may be updated based on patient status, additional functional criteria and insurance authorization.   Follow Up Recommendations  Follow physician's recommendations for discharge plan and follow up therapies     Assistance Recommended at Discharge Intermittent Supervision/Assistance  Patient can return home with the following A little help with walking and/or transfers;A little help with bathing/dressing/bathroom;Help with stairs or ramp for entrance;Assist for transportation;Assistance with cooking/housework    Functional Status Assessment  Patient has had a recent decline in their functional status and demonstrates the ability to make significant improvements in function in a reasonable and predictable amount of time.  Equipment  Recommendations  None recommended by OT       Precautions / Restrictions Precautions Precautions: Fall;Shoulder Shoulder Interventions: Timmothy Sours joy ultra sling;Shoulder abduction pillow;Off for dressing/bathing/exercises Required Braces or Orthoses: Sling Restrictions Weight Bearing Restrictions: Yes RUE Weight Bearing: Non weight bearing Other Position/Activity Restrictions: Sling on to RUE at all times except ROM and hygiene.  OK to start pendulums and PROM to 90 deg FF and 30 deg ER; ok for hand and wrist ROM.      Mobility Bed Mobility               General bed mobility comments: seated in recliner chair    Transfers Overall transfer level: Needs assistance Equipment used: None Transfers: Sit to/from Stand Sit to Stand: Supervision           General transfer comment: Good eccentric and concentric control and stability with min verbal cues for sequencing to protect RUE      Balance Overall balance assessment: Needs assistance   Sitting balance-Leahy Scale: Normal     Standing balance support: During functional activity, No upper extremity supported Standing balance-Leahy Scale: Good                             ADL either performed or assessed with clinical judgement   ADL Overall ADL's : Needs assistance/impaired                 Upper Body Dressing : Moderate assistance;Sitting   Lower Body Dressing: Min guard;Sit to/from stand                       Vision Patient Visual Report: No change from baseline  Pertinent Vitals/Pain Pain Assessment Pain Assessment: No/denies pain     Hand Dominance Right   Extremity/Trunk Assessment Upper Extremity Assessment Upper Extremity Assessment: RUE deficits/detail RUE Deficits / Details: NWB RUE: Unable to fully assess due to immobilization RUE Sensation: decreased light touch   Lower Extremity Assessment Lower Extremity Assessment: Overall WFL for tasks assessed        Communication Communication Communication: No difficulties   Cognition Arousal/Alertness: Awake/alert Behavior During Therapy: WFL for tasks assessed/performed Overall Cognitive Status: Within Functional Limits for tasks assessed                                                  Home Living Family/patient expects to be discharged to:: Private residence Living Arrangements: Alone Available Help at Discharge: Family;Available 24 hours/day Type of Home: House Home Access: Ramped entrance     Home Layout: One level     Bathroom Shower/Tub: Walk-in shower         Home Equipment: Shower seat - built in;Cane - single point;Cane - quad;Rollator (4 wheels);BSC/3in1   Additional Comments: owns upright walker      Prior Functioning/Environment Prior Level of Function : Independent/Modified Independent             Mobility Comments: Ind amb community distances without an AD, one fall in the last 6 months getting up at night to use the BR at daughter's house and misjudged where bed was ADLs Comments: Ind with ADLs                 OT Goals(Current goals can be found in the care plan section) Acute Rehab OT Goals Patient Stated Goal: to return home OT Goal Formulation: With patient Time For Goal Achievement: 11/21/22 Potential to Achieve Goals: Good  OT Frequency:         AM-PAC OT "6 Clicks" Daily Activity     Outcome Measure Help from another person eating meals?: None Help from another person taking care of personal grooming?: None Help from another person toileting, which includes using toliet, bedpan, or urinal?: A Little Help from another person bathing (including washing, rinsing, drying)?: A Little Help from another person to put on and taking off regular upper body clothing?: None Help from another person to put on and taking off regular lower body clothing?: A Little 6 Click Score: 21   End of Session Nurse Communication: Mobility  status  Activity Tolerance: Patient tolerated treatment well Patient left: in chair;with call bell/phone within reach                   Time: 1007-1032 OT Time Calculation (min): 25 min Charges:  OT General Charges $OT Visit: 1 Visit OT Evaluation $OT Eval Low Complexity: 1 Low OT Treatments $Self Care/Home Management : 8-22 mins  Darleen Crocker, MS, OTR/L , CBIS ascom [phone]  11/21/22, 1:05 PM

## 2022-11-21 NOTE — Anesthesia Postprocedure Evaluation (Signed)
Anesthesia Post Note  Patient: Nancy Hickman  Procedure(s) Performed: Right reverse shoulder arthroplasty, biceps tenodesis (Right: Shoulder)  Patient location during evaluation: PACU Anesthesia Type: General Level of consciousness: awake and alert Pain management: pain level controlled Vital Signs Assessment: post-procedure vital signs reviewed and stable Respiratory status: spontaneous breathing, nonlabored ventilation, respiratory function stable and patient connected to nasal cannula oxygen Cardiovascular status: blood pressure returned to baseline and stable Postop Assessment: no apparent nausea or vomiting Anesthetic complications: no   No notable events documented.   Last Vitals:  Vitals:   11/21/22 0548 11/21/22 0722  BP: (!) 113/50 (!) 109/54  Pulse: 62 60  Resp: 15 16  Temp: 36.9 C 36.9 C  SpO2: 97% 98%    Last Pain:  Vitals:   11/20/22 2035  TempSrc:   PainSc: 0-No pain                 Arita Miss

## 2022-11-21 NOTE — Evaluation (Signed)
Physical Therapy Evaluation Patient Details Name: Nancy Hickman MRN: [id number] DOB: September 18, 1941 Today's Date: 11/21/2022  History of Present Illness  Pt is an 82 yo F diagnosed with right shoulder glenohumeral osteoarthritis and is s/p elective right reverse total shoulder arthroplasty and biceps tenodesis.  PMH includes DM, CKD, and MI.   Clinical Impression  Pt was pleasant and motivated to participate during the session and put forth good effort throughout. Pt required no physical assistance during the session but did require cuing to ensure compliance with RUE WB status and to protect her RUE from objects/obstacles during gait and transfers. Pt was generally steady during ambulation with some minor drifting left/right but no overt LOB.  Pt reported no adverse symptoms during the session with SpO2 and HR WNL on room air.  Pt will benefit from OPPT upon discharge to safely address deficits listed in patient problem list for decreased caregiver assistance and eventual return to PLOF.           Recommendations for follow up therapy are one component of a multi-disciplinary discharge planning process, led by the attending physician.  Recommendations may be updated based on patient status, additional functional criteria and insurance authorization.  Follow Up Recommendations Outpatient PT      Assistance Recommended at Discharge Intermittent Supervision/Assistance  Patient can return home with the following  A little help with walking and/or transfers;A little help with bathing/dressing/bathroom;Assistance with cooking/housework;Direct supervision/assist for medications management;Assistance with feeding;Assist for transportation;Help with stairs or ramp for entrance    Equipment Recommendations None recommended by PT  Recommendations for Other Services       Functional Status Assessment Patient has had a recent decline in their functional status and demonstrates the ability to make  significant improvements in function in a reasonable and predictable amount of time.     Precautions / Restrictions Precautions Precautions: Fall Required Braces or Orthoses: Sling Restrictions Weight Bearing Restrictions: Yes RUE Weight Bearing: Non weight bearing Other Position/Activity Restrictions: Sling on to RUE at all times except ROM and hygiene.  OK to start pendulums and PROM to 90 deg FF and 30 deg ER; ok for hand and wrist ROM.      Mobility  Bed Mobility Overal bed mobility: Modified Independent             General bed mobility comments: Min extra time and effort only    Transfers Overall transfer level: Needs assistance Equipment used: None Transfers: Sit to/from Stand Sit to Stand: Supervision           General transfer comment: Good eccentric and concentric control and stability with min verbal cues for sequencing to protect RUE    Ambulation/Gait Ambulation/Gait assistance: Supervision Gait Distance (Feet): 150 Feet Assistive device: None Gait Pattern/deviations: Step-through pattern, Decreased step length - right, Decreased step length - left, Drifts right/left Gait velocity: min decreased     General Gait Details: Min drifting left/right but generally steady with no overt LOB and with no adverse symptoms noted; mod verbal cues for scanning environment to avoid striking obstacle with RUE  Stairs            Wheelchair Mobility    Modified Rankin (Stroke Patients Only)       Balance Overall balance assessment: Needs assistance   Sitting balance-Leahy Scale: Normal     Standing balance support: During functional activity, No upper extremity supported Standing balance-Leahy Scale: Good  Pertinent Vitals/Pain Pain Assessment Pain Assessment: No/denies pain    Home Living Family/patient expects to be discharged to:: Private residence Living Arrangements: Alone Available Help at  Discharge: Family;Available 24 hours/day Type of Home: House Home Access: Ramped entrance       Home Layout: One level Home Equipment: Shower seat - built in;Cane - single point;Cane - quad;Rollator (4 wheels);BSC/3in1 Additional Comments: owns upright walker    Prior Function Prior Level of Function : Independent/Modified Independent             Mobility Comments: Ind amb community distances without an AD, one fall in the last 6 months getting up at night to use the BR at daughter's house and misjudged where bed was ADLs Comments: Ind with ADLs     Hand Dominance   Dominant Hand: Right    Extremity/Trunk Assessment   Upper Extremity Assessment Upper Extremity Assessment: Defer to OT evaluation;RUE deficits/detail RUE Deficits / Details: Sensation to light touch absent with pt stating that she thinks she is starting to be able to feel her thumb RUE: Unable to fully assess due to immobilization RUE Sensation: decreased light touch    Lower Extremity Assessment Lower Extremity Assessment: Overall WFL for tasks assessed       Communication   Communication: No difficulties  Cognition Arousal/Alertness: Awake/alert Behavior During Therapy: WFL for tasks assessed/performed Overall Cognitive Status: Within Functional Limits for tasks assessed                                          General Comments      Exercises Other Exercises Other Exercises: Car transfer sequencing education to protect RUE   Assessment/Plan    PT Assessment Patient needs continued PT services  PT Problem List Decreased strength;Decreased range of motion;Decreased balance;Decreased mobility;Decreased safety awareness;Decreased knowledge of precautions       PT Treatment Interventions DME instruction;Gait training;Functional mobility training;Therapeutic activities;Therapeutic exercise;Balance training;Patient/family education    PT Goals (Current goals can be found in the  Care Plan section)  Acute Rehab PT Goals Patient Stated Goal: To be able to get out of the house PT Goal Formulation: With patient Time For Goal Achievement: 12/04/22 Potential to Achieve Goals: Good    Frequency BID     Co-evaluation               AM-PAC PT "6 Clicks" Mobility  Outcome Measure Help needed turning from your back to your side while in a flat bed without using bedrails?: A Little Help needed moving from lying on your back to sitting on the side of a flat bed without using bedrails?: A Little Help needed moving to and from a bed to a chair (including a wheelchair)?: A Little Help needed standing up from a chair using your arms (e.g., wheelchair or bedside chair)?: A Little Help needed to walk in hospital room?: A Little Help needed climbing 3-5 steps with a railing? : A Little 6 Click Score: 18    End of Session   Activity Tolerance: Patient tolerated treatment well Patient left: in chair;with call bell/phone within reach;with chair alarm set;with SCD's reapplied;Other (comment) (Polar care donned to RUE) Nurse Communication: Mobility status;Weight bearing status PT Visit Diagnosis: Muscle weakness (generalized) (M62.81);Unsteadiness on feet (R26.81)    Time: [id number] PT Time Calculation (min) (ACUTE ONLY): 38 min   Charges:   PT Evaluation $PT Eval Moderate  Complexity: 1 Mod PT Treatments $Therapeutic Activity: 8-22 mins       D. Scott Ariele Vidrio PT, DPT 11/21/22, 11:19 AM
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
## 2022-11-22 LAB — SURGICAL PATHOLOGY

## 2023-07-19 ENCOUNTER — Encounter: Payer: Self-pay | Admitting: Nurse Practitioner

## 2023-07-19 ENCOUNTER — Ambulatory Visit: Payer: 59 | Attending: Nurse Practitioner | Admitting: Nurse Practitioner

## 2023-07-19 VITALS — BP 92/64 | HR 74 | Ht <= 58 in | Wt 150.6 lb

## 2023-07-19 DIAGNOSIS — I480 Paroxysmal atrial fibrillation: Secondary | ICD-10-CM | POA: Diagnosis not present

## 2023-07-19 DIAGNOSIS — I951 Orthostatic hypotension: Secondary | ICD-10-CM | POA: Diagnosis not present

## 2023-07-19 DIAGNOSIS — E119 Type 2 diabetes mellitus without complications: Secondary | ICD-10-CM

## 2023-07-19 DIAGNOSIS — Z86718 Personal history of other venous thrombosis and embolism: Secondary | ICD-10-CM

## 2023-07-19 NOTE — Patient Instructions (Signed)
Medication Instructions:  No changes *If you need a refill on your cardiac medications before your next appointment, please call your pharmacy*   Lab Work: None ordered If you have labs (blood work) drawn today and your tests are completely normal, you will receive your results only by: MyChart Message (if you have MyChart) OR A paper copy in the mail If you have any lab test that is abnormal or we need to change your treatment, we will call you to review the results.   Testing/Procedures: None ordered   Follow-Up: At [facility], you and your health needs are our priority.  As part of our continuing mission to provide you with exceptional heart care, we have created designated Provider Care Teams.  These Care Teams include your primary Cardiologist (physician) and Advanced Practice Providers (APPs -  Physician Assistants and Nurse Practitioners) who all work together to provide you with the care you need, when you need it.  We recommend signing up for the patient portal called "MyChart".  Sign up information is provided on this After Visit Summary.  MyChart is used to connect with patients for Virtual Visits (Telemedicine).  Patients are able to view lab/test results, encounter notes, upcoming appointments, etc.  Non-urgent messages can be sent to your provider as well.   To learn more about what you can do with MyChart, go to [email or web address].    Your next appointment:   12 month(s)  Provider:   You may see Lanier Prude, MD or one of the following Advanced Practice Providers on your designated Care Team:   Nicolasa Ducking, NP
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
## 2023-08-05 ENCOUNTER — Inpatient Hospital Stay: Payer: 59

## 2023-08-06 ENCOUNTER — Other Ambulatory Visit: Payer: Self-pay

## 2023-08-06 ENCOUNTER — Inpatient Hospital Stay: Payer: 59 | Attending: Oncology

## 2023-08-06 DIAGNOSIS — D509 Iron deficiency anemia, unspecified: Secondary | ICD-10-CM | POA: Diagnosis present

## 2023-08-06 DIAGNOSIS — Z148 Genetic carrier of other disease: Secondary | ICD-10-CM

## 2023-08-06 DIAGNOSIS — K5792 Diverticulitis of intestine, part unspecified, without perforation or abscess without bleeding: Secondary | ICD-10-CM | POA: Insufficient documentation

## 2023-08-06 DIAGNOSIS — D75839 Thrombocytosis, unspecified: Secondary | ICD-10-CM | POA: Diagnosis not present

## 2023-08-06 DIAGNOSIS — Z9081 Acquired absence of spleen: Secondary | ICD-10-CM | POA: Insufficient documentation

## 2023-08-06 DIAGNOSIS — Z87891 Personal history of nicotine dependence: Secondary | ICD-10-CM | POA: Diagnosis not present

## 2023-08-06 LAB — IRON AND TIBC
Iron: 40 ug/dL (ref 28–170)
Saturation Ratios: 9 % — ABNORMAL LOW (ref 10.4–31.8)
TIBC: 433 ug/dL (ref 250–450)
UIBC: 393 ug/dL

## 2023-08-06 LAB — CMP (CANCER CENTER ONLY)
ALT: 12 U/L (ref 0–44)
AST: 20 U/L (ref 15–41)
Albumin: 3.7 g/dL (ref 3.5–5.0)
Alkaline Phosphatase: 72 U/L (ref 38–126)
Anion gap: 6 (ref 5–15)
BUN: 24 mg/dL — ABNORMAL HIGH (ref 8–23)
CO2: 21 mmol/L — ABNORMAL LOW (ref 22–32)
Calcium: 9.1 mg/dL (ref 8.9–10.3)
Chloride: 112 mmol/L — ABNORMAL HIGH (ref 98–111)
Creatinine: 1.03 mg/dL — ABNORMAL HIGH (ref 0.44–1.00)
GFR, Estimated: 55 mL/min — ABNORMAL LOW (ref >60)
Glucose, Bld: 103 mg/dL — ABNORMAL HIGH (ref 70–99)
Potassium: 4.1 mmol/L (ref 3.5–5.1)
Sodium: 139 mmol/L (ref 135–145)
Total Bilirubin: 0.4 mg/dL (ref 0.3–1.2)
Total Protein: 7.1 g/dL (ref 6.5–8.1)

## 2023-08-06 LAB — CBC WITH DIFFERENTIAL (CANCER CENTER ONLY)
Abs Immature Granulocytes: 0.02 10*3/uL (ref 0.00–0.07)
Basophils Absolute: 0.1 10*3/uL (ref 0.0–0.1)
Basophils Relative: 1 %
Eosinophils Absolute: 0.4 10*3/uL (ref 0.0–0.5)
Eosinophils Relative: 6 %
HCT: 37.5 % (ref 36.0–46.0)
Hemoglobin: 12.1 g/dL (ref 12.0–15.0)
Immature Granulocytes: 0 %
Lymphocytes Relative: 37 %
Lymphs Abs: 2.4 10*3/uL (ref 0.7–4.0)
MCH: 29.7 pg (ref 26.0–34.0)
MCHC: 32.3 g/dL (ref 30.0–36.0)
MCV: 92.1 fL (ref 80.0–100.0)
Monocytes Absolute: 0.7 10*3/uL (ref 0.1–1.0)
Monocytes Relative: 10 %
Neutro Abs: 3 10*3/uL (ref 1.7–7.7)
Neutrophils Relative %: 46 %
Platelet Count: 332 10*3/uL (ref 150–400)
RBC: 4.07 MIL/uL (ref 3.87–5.11)
RDW: 16.4 % — ABNORMAL HIGH (ref 11.5–15.5)
WBC Count: 6.5 10*3/uL (ref 4.0–10.5)
nRBC: 0 % (ref 0.0–0.2)

## 2023-08-06 LAB — FERRITIN: Ferritin: 7 ng/mL — ABNORMAL LOW (ref 11–307)

## 2023-08-08 ENCOUNTER — Encounter: Payer: Self-pay | Admitting: Oncology

## 2023-08-08 ENCOUNTER — Inpatient Hospital Stay: Payer: 59 | Admitting: Oncology

## 2023-08-08 VITALS — BP 114/61 | HR 64 | Temp 97.8°F | Resp 18 | Wt 150.4 lb

## 2023-08-08 DIAGNOSIS — R61 Generalized hyperhidrosis: Secondary | ICD-10-CM | POA: Diagnosis not present

## 2023-08-08 DIAGNOSIS — D508 Other iron deficiency anemias: Secondary | ICD-10-CM

## 2023-08-08 DIAGNOSIS — Z148 Genetic carrier of other disease: Secondary | ICD-10-CM | POA: Diagnosis not present

## 2023-08-08 DIAGNOSIS — D509 Iron deficiency anemia, unspecified: Secondary | ICD-10-CM | POA: Diagnosis not present

## 2023-08-08 NOTE — Assessment & Plan Note (Signed)
Unknown etiology, could be due to recent COVID19 infection.  No other constitutional symptoms.

## 2023-08-08 NOTE — Assessment & Plan Note (Addendum)
Lab Results  Component Value Date   HGB 12.1 08/06/2023   TIBC 433 08/06/2023   IRONPCTSAT 9 (L) 08/06/2023   FERRITIN 7 (L) 08/06/2023   She can not tolerate oral iron Recommend iron rich food, iron contained multivitamin , vitamin C supplement Recommend GI work up

## 2023-08-08 NOTE — Progress Notes (Signed)
Pt here for follow up. Reports she had upper respiratory infection back in June and was given antibiotics, but still has stopped ear.

## 2023-08-08 NOTE — Assessment & Plan Note (Addendum)
Labs are reviewed and discussed with patient. She is iron deficiency, no phlebotomy Avoid alcohol, recommend patient to have first-degree relatives screened for hemochromatosis.
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
## 2023-12-09 ENCOUNTER — Telehealth: Payer: Self-pay | Admitting: Oncology

## 2023-12-09 NOTE — Telephone Encounter (Signed)
 Pateint left message with answering service to cancel her 3/4 lab appt @ 2 pm and her MD appt on 3/6 @ 2 pm.

## 2023-12-10 ENCOUNTER — Inpatient Hospital Stay: Payer: 59

## 2023-12-12 ENCOUNTER — Inpatient Hospital Stay: Payer: 59 | Admitting: Oncology

## 2024-01-20 ENCOUNTER — Telehealth: Payer: Self-pay | Admitting: Gastroenterology

## 2024-01-20 NOTE — Telephone Encounter (Signed)
 The patient called requesting to schedule an appointment. She stated that Dr. Wilhelmenia Harada is concerned she may be experiencing internal bleeding. According to the patient, her recent blood work results were abnormal, and she is losing a significant amount of blood.  She is seeking guidance on the next steps and asked if the appointment on 01/22/2024 at 1:45 PM is still available. Per Ginger's instruction, I am reaching out to confirm availability and receive further direction on how to proceed.

## 2024-01-30 ENCOUNTER — Other Ambulatory Visit: Payer: Self-pay | Admitting: Physician Assistant

## 2024-01-30 DIAGNOSIS — R519 Headache, unspecified: Secondary | ICD-10-CM

## 2024-02-06 ENCOUNTER — Ambulatory Visit
Admission: RE | Admit: 2024-02-06 | Discharge: 2024-02-06 | Disposition: A | Discharge status: Home / Self Care | Source: Ambulatory Visit | Attending: Physician Assistant | Admitting: Physician Assistant

## 2024-02-06 DIAGNOSIS — R519 Headache, unspecified: Secondary | ICD-10-CM | POA: Diagnosis present

## 2024-07-10 ENCOUNTER — Encounter: Payer: Self-pay | Admitting: *Deleted

## 2024-07-21 ENCOUNTER — Ambulatory Visit: Admitting: Nurse Practitioner

## 2024-09-02 ENCOUNTER — Ambulatory Visit: Admitting: Nurse Practitioner
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
## 2024-09-16 ENCOUNTER — Encounter: Payer: Self-pay | Admitting: Nurse Practitioner

## 2024-09-16 ENCOUNTER — Ambulatory Visit: Attending: Nurse Practitioner | Admitting: Nurse Practitioner

## 2024-09-16 VITALS — BP 100/60 | HR 78 | Ht <= 58 in | Wt 153.2 lb

## 2024-09-16 DIAGNOSIS — I951 Orthostatic hypotension: Secondary | ICD-10-CM | POA: Diagnosis not present

## 2024-09-16 DIAGNOSIS — R0609 Other forms of dyspnea: Secondary | ICD-10-CM | POA: Diagnosis not present

## 2024-09-16 DIAGNOSIS — R195 Other fecal abnormalities: Secondary | ICD-10-CM

## 2024-09-16 DIAGNOSIS — R42 Dizziness and giddiness: Secondary | ICD-10-CM | POA: Diagnosis not present

## 2024-09-16 DIAGNOSIS — I480 Paroxysmal atrial fibrillation: Secondary | ICD-10-CM

## 2024-09-16 DIAGNOSIS — L819 Disorder of pigmentation, unspecified: Secondary | ICD-10-CM

## 2024-09-16 NOTE — Patient Instructions (Signed)
 Medication Instructions:  No changes *If you need a refill on your cardiac medications before your next appointment, please call your pharmacy*  Lab Work: None ordered If you have labs (blood work) drawn today and your tests are completely normal, you will receive your results only by: MyChart Message (if you have MyChart) OR A paper copy in the mail If you have any lab test that is abnormal or we need to change your treatment, we will call you to review the results.  Testing/Procedures: None ordered  Follow-Up: At [facility], you and your health needs are our priority.  As part of our continuing mission to provide you with exceptional heart care, our providers are all part of one team.  This team includes your primary Cardiologist (physician) and Advanced Practice Providers or APPs (Physician Assistants and Nurse Practitioners) who all work together to provide you with the care you need, when you need it.  Your next appointment:   12 month(s)  Provider:   You may see Dr. Mady or one of the following Advanced Practice Providers on your designated Care Team:   Lonni Meager, NP Lesley Maffucci, PA-C Bernardino Bring, PA-C Cadence Tallassee, PA-C Tylene Lunch, NP Barnie Hila, NP    Follow up with your primary care doctor and GI provider  We recommend signing up for the patient portal called MyChart.  Sign up information is provided on this After Visit Summary.  MyChart is used to connect with patients for Virtual Visits (Telemedicine).  Patients are able to view lab/test results, encounter notes, upcoming appointments, etc.  Non-urgent messages can be sent to your provider as well.   To learn more about what you can do with MyChart, go to [email or web address].

## 2024-09-17 ENCOUNTER — Other Ambulatory Visit: Payer: Self-pay | Admitting: *Deleted

## 2024-09-17 DIAGNOSIS — R0609 Other forms of dyspnea: Secondary | ICD-10-CM

## 2024-10-15 ENCOUNTER — Telehealth: Payer: Self-pay

## 2024-10-15 NOTE — Telephone Encounter (Signed)
 Patient called our office stating that she is currently having issues with her colostomy. Patient stated that she is having intraabdominal fluid collection again. Patient stated that she called your office before Christmas and stated that she was told that she would be getting a call back but has not. However, the patient stated that yesterday she noticed some brown looking color by the rectum and is afraid that something is not going right. Please advise. I had told her to call your office again but she was afraid that she would be ignored again.

## 2024-10-19 ENCOUNTER — Ambulatory Visit: Admitting: Surgery

## 2024-10-19 ENCOUNTER — Encounter: Payer: Self-pay | Admitting: Surgery

## 2024-10-19 VITALS — BP 129/80 | HR 103 | Temp 98.4°F | Ht <= 58 in | Wt 150.0 lb

## 2024-10-19 DIAGNOSIS — Z932 Ileostomy status: Secondary | ICD-10-CM | POA: Diagnosis not present

## 2024-10-19 DIAGNOSIS — K432 Incisional hernia without obstruction or gangrene: Secondary | ICD-10-CM

## 2024-10-19 DIAGNOSIS — K435 Parastomal hernia without obstruction or  gangrene: Secondary | ICD-10-CM | POA: Diagnosis not present

## 2024-10-19 NOTE — Patient Instructions (Signed)
Minimally Invasive Partial Colectomy, Adult, Care After The following information offers guidance on how to care for yourself after your procedure. Your health care provider may also give you more specific instructions. If you have problems or questions, contact your health care provider. What can I expect after the surgery? After the procedure, it is common to have: Pain, bruising, and swelling. Bloating. Weakness and tiredness (fatigue). Changes to your bowel movements, especially having bowel movements more often. Follow these instructions at home: Medicines Take over-the-counter and prescription medicines only as told by your health care provider. If you were prescribed an antibiotic medicine, take it as told by your health care provider. Do not stop using the antibiotic even if you start to feel better. Ask your health care provider if the medicine prescribed to you: Requires you to avoid driving or using machinery. Can cause constipation. You may need to take these actions to prevent or treat constipation: Drink enough fluids to keep your urine pale yellow. Take over-the-counter or prescription medicines. Limit foods that are high in fat and processed sugars, such as fried or sweet foods. Eating and drinking Follow instructions from your health care provider about what you may eat and drink. Do not drink alcohol if your health care provider tells you not to drink. Eat a low-fiber diet for the first 4 weeks after surgery or as told by your health care provider.. Most people on a low-fiber eating plan should eat less than 10 grams (g) of fiber a day. Follow recommendations from your health care provider or dietitian about how much fiber you should have each day. Always check food labels to know the fiber content of packaged foods. In general, a low-fiber food will have fewer than 2 g of fiber per serving. In general, try to avoid whole grains, raw fruits and vegetables, dried fruit, tough  cuts of meat, nuts, and seeds. Incision care  Follow instructions from your health care provider about how to take care of your incisions. Make sure you: Wash your hands with soap and water for at least 20 seconds before and after you change your bandage (dressing). If soap and water are not available, use hand sanitizer. Change your dressing as told by your health care provider. Leave stitches (sutures), skin glue, or adhesive strips in place. These skin closures may need to stay in place for 2 weeks or longer. If adhesive strip edges start to loosen and curl up, you may trim the loose edges. Do not remove adhesive strips completely unless your health care provider tells you to do that. Check your incision area every day for signs of infection. Check for: More redness, swelling, or pain. Fluid or blood. Warmth. Pus or a bad smell. Activity Rest as told by your health care provider. Avoid sitting for a long time without moving. Get up to take short walks every 1-2 hours. This is important to improve blood flow and breathing. Ask for help if you feel weak or unsteady. You may have to avoid lifting. Ask your health care provider how much you can safely lift. Return to your normal activities as told by your health care provider. Ask your health care provider what activities are safe for you. General instructions Do not use any products that contain nicotine or tobacco. These products include cigarettes, chewing tobacco, and vaping devices, such as e-cigarettes. If you need help quitting, ask your health care provider. Do not take baths, swim, or use a hot tub until your health care provider  approves. Ask your health care provider if you may take showers. You may only be allowed to take sponge baths. Wear compression stockings as told by your health care provider. These stockings help to prevent blood clots and reduce swelling in your legs. Keep all follow-up visits. This is important to monitor  healing and check for any complications. Contact a health care provider if: Medicine is not controlling your pain. You have chills or fever. You have any signs of infection in your incision areas. You have a persistent cough. You have nausea or vomiting. You develop a rash. You have not had a bowel movement in 3 days. Get help right away if: You have severe pain. Your incisions break open after sutures or staples have been removed. You are bleeding from your rectum or have blood in your stool. You have a warm, tender swelling in your leg. You have chest pain or trouble breathing. You have increased swelling in the abdomen. You feel light-headed or you faint. These symptoms may be an emergency. Get help right away. Call 911. Do not wait to see if the symptoms will go away. Do not drive yourself to the hospital. Summary After surgery, it is common to have some pain, bruising, swelling, bloating, tiredness, weakness, or changes to your bowel movements. Follow instructions from your health care provider about what to eat and drink. Return to your normal activities as told by your health care provider. Check your incision area every day for signs of infection. Get help right away if you have chest pain or trouble breathing. This information is not intended to replace advice given to you by your health care provider. Make sure you discuss any questions you have with your health care provider. Document Revised: 01/10/2022 Document Reviewed: 01/10/2022 Elsevier Patient Education  2024 [employer].
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
## 2024-10-20 ENCOUNTER — Other Ambulatory Visit: Payer: Self-pay

## 2024-10-20 ENCOUNTER — Telehealth: Payer: Self-pay

## 2024-10-20 DIAGNOSIS — R109 Unspecified abdominal pain: Secondary | ICD-10-CM

## 2024-10-20 NOTE — Telephone Encounter (Signed)
 CT scheduled for 10/27/2024 12:45 pm at [facility] [place]. Tried calling pt with the information but her phone number in her chart is not correct. I LVM on daughter's phone to call our office for appt and to update phone number.

## 2024-10-22 ENCOUNTER — Ambulatory Visit: Payer: Self-pay | Admitting: Emergency Medicine

## 2024-10-22 ENCOUNTER — Ambulatory Visit: Attending: Emergency Medicine

## 2024-10-22 DIAGNOSIS — R0609 Other forms of dyspnea: Secondary | ICD-10-CM

## 2024-10-22 LAB — ECHOCARDIOGRAM COMPLETE
AR max vel: 2.67 cm2
AV Area VTI: 2.56 cm2
AV Area mean vel: 2.5 cm2
AV Mean grad: 3 mmHg
AV Peak grad: 5.9 mmHg
Ao pk vel: 1.21 m/s
Area-P 1/2: 3.65 cm2
S' Lateral: 2.37 cm

## 2024-10-27 ENCOUNTER — Ambulatory Visit
Admission: RE | Admit: 2024-10-27 | Discharge: 2024-10-27 | Disposition: A | Discharge status: Home / Self Care | Source: Ambulatory Visit | Attending: Surgery | Admitting: Surgery

## 2024-10-27 DIAGNOSIS — R109 Unspecified abdominal pain: Secondary | ICD-10-CM | POA: Diagnosis present

## 2024-10-27 LAB — POCT I-STAT CREATININE: Creatinine, Ser: 1.3 mg/dL — ABNORMAL HIGH (ref 0.44–1.00)

## 2024-10-27 MED ORDER — IOHEXOL 300 MG/ML  SOLN
80.0000 mL | Freq: Once | INTRAMUSCULAR | Status: AC | PRN
  Administered 2024-10-27: 80 mL via INTRAVENOUS

## 2024-11-02 ENCOUNTER — Ambulatory Visit: Admitting: Surgery

## 2024-11-27 ENCOUNTER — Ambulatory Visit: Admitting: Surgery

## 2024-12-16 ENCOUNTER — Ambulatory Visit: Admitting: Nurse Practitioner
